# Patient Record
Sex: Female | Born: 1985
Health system: Southern US, Community
[De-identification: ages and names within clinical notes are randomized; demographics above are authoritative.]

## PROBLEM LIST (undated history)

## (undated) DIAGNOSIS — T7840XA Allergy, unspecified, initial encounter: Secondary | ICD-10-CM

## (undated) DIAGNOSIS — K219 Gastro-esophageal reflux disease without esophagitis: Secondary | ICD-10-CM

## (undated) DIAGNOSIS — Z8719 Personal history of other diseases of the digestive system: Secondary | ICD-10-CM

## (undated) DIAGNOSIS — E669 Obesity, unspecified: Secondary | ICD-10-CM

## (undated) DIAGNOSIS — J45909 Unspecified asthma, uncomplicated: Secondary | ICD-10-CM

## (undated) HISTORY — PX: BRONCHOSCOPY: SUR163

## (undated) HISTORY — DX: Gastro-esophageal reflux disease without esophagitis: K21.9

## (undated) HISTORY — PX: CHOLECYSTECTOMY: SHX55

## (undated) HISTORY — PX: ESOPHAGOGASTRODUODENOSCOPY: SHX1529

## (undated) HISTORY — DX: Unspecified asthma, uncomplicated: J45.909

## (undated) HISTORY — PX: EXTERNAL EAR SURGERY: SHX627

## (undated) HISTORY — DX: Obesity, unspecified: E66.9

## (undated) HISTORY — DX: Allergy, unspecified, initial encounter: T78.40XA

---
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
## 2004-03-08 ENCOUNTER — Other Ambulatory Visit: Admission: RE | Admit: 2004-03-08 | Discharge: 2004-03-08 | Payer: Self-pay | Admitting: *Deleted

## 2004-10-05 ENCOUNTER — Encounter (HOSPITAL_COMMUNITY): Admission: RE | Admit: 2004-10-05 | Discharge: 2005-01-03 | Payer: Self-pay | Admitting: Endocrinology

## 2005-03-13 ENCOUNTER — Other Ambulatory Visit: Admission: RE | Admit: 2005-03-13 | Discharge: 2005-03-13 | Payer: Self-pay | Admitting: *Deleted

## 2006-03-12 ENCOUNTER — Other Ambulatory Visit: Admission: RE | Admit: 2006-03-12 | Discharge: 2006-03-12 | Payer: Self-pay | Admitting: *Deleted

## 2007-05-12 ENCOUNTER — Other Ambulatory Visit: Admission: RE | Admit: 2007-05-12 | Discharge: 2007-05-12 | Payer: Self-pay | Admitting: *Deleted

## 2008-05-24 ENCOUNTER — Other Ambulatory Visit: Admission: RE | Admit: 2008-05-24 | Discharge: 2008-05-24 | Payer: Self-pay | Admitting: Family Medicine

## 2008-11-16 ENCOUNTER — Encounter: Admission: RE | Admit: 2008-11-16 | Discharge: 2008-11-16 | Payer: Self-pay | Admitting: Family Medicine

## 2008-11-16 ENCOUNTER — Encounter: Payer: Self-pay | Admitting: Emergency Medicine

## 2008-11-18 ENCOUNTER — Ambulatory Visit: Payer: Self-pay | Admitting: Emergency Medicine

## 2008-11-18 DIAGNOSIS — J45909 Unspecified asthma, uncomplicated: Secondary | ICD-10-CM | POA: Insufficient documentation

## 2008-11-18 DIAGNOSIS — J984 Other disorders of lung: Secondary | ICD-10-CM | POA: Insufficient documentation

## 2008-11-18 DIAGNOSIS — J309 Allergic rhinitis, unspecified: Secondary | ICD-10-CM | POA: Insufficient documentation

## 2008-11-25 ENCOUNTER — Encounter: Payer: Self-pay | Admitting: Emergency Medicine

## 2008-11-25 ENCOUNTER — Ambulatory Visit: Payer: Self-pay | Admitting: Emergency Medicine

## 2008-11-25 ENCOUNTER — Ambulatory Visit: Admission: RE | Admit: 2008-11-25 | Discharge: 2008-11-25 | Payer: Self-pay | Admitting: Emergency Medicine

## 2008-11-26 ENCOUNTER — Encounter: Admission: RE | Admit: 2008-11-26 | Discharge: 2008-11-26 | Payer: Self-pay | Admitting: Family Medicine

## 2008-11-29 ENCOUNTER — Telehealth (INDEPENDENT_AMBULATORY_CARE_PROVIDER_SITE_OTHER): Payer: Self-pay | Admitting: *Deleted

## 2008-12-31 ENCOUNTER — Ambulatory Visit: Payer: Self-pay | Admitting: Emergency Medicine

## 2009-04-28 ENCOUNTER — Telehealth: Payer: Self-pay | Admitting: Emergency Medicine

## 2009-04-28 ENCOUNTER — Ambulatory Visit: Payer: Self-pay | Admitting: Cardiology

## 2009-05-26 ENCOUNTER — Other Ambulatory Visit: Admission: RE | Admit: 2009-05-26 | Discharge: 2009-05-26 | Payer: Self-pay | Admitting: Family Medicine

## 2010-04-10 IMAGING — CT CT ANGIO CHEST
2 of 7 series · 19 of 36 positions shown · IV contrast ([ID] OMNI 300)
Comparison: None

CLINICAL DATA: Shortness of breath

CT ANGIOGRAPHY CHEST
TECHNIQUE: Multidetector CT imaging of the chest using the
standard protocol during bolus administration of intravenous
contrast. Multiplanar reconstructed images including MIPs were
obtained and reviewed to evaluate the vascular anatomy.
Contrast: 125 ml Imnipaque-YZZ IV

[Series 6: pe thin 1.25 · axial · 0.70mm/px · z∈[-198,+28]mm · 18 of 401 slices shown]
[im 20/401  lung]
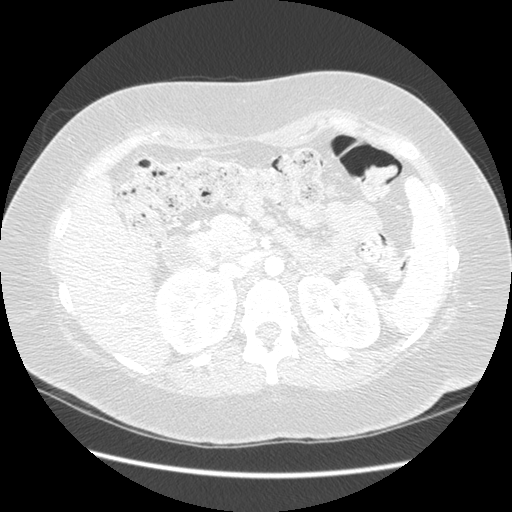
[im 39/401  mediastinal]
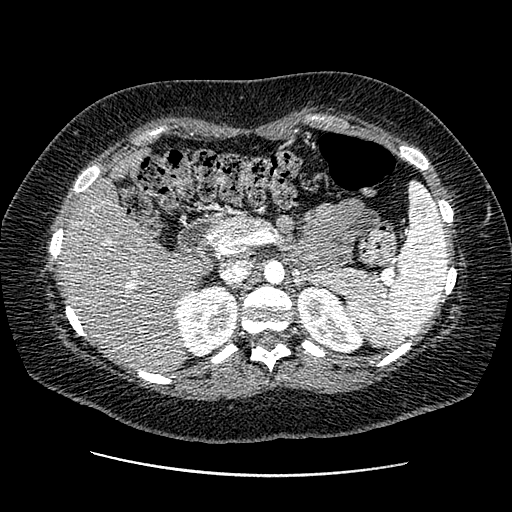
[im 58/401  lung]
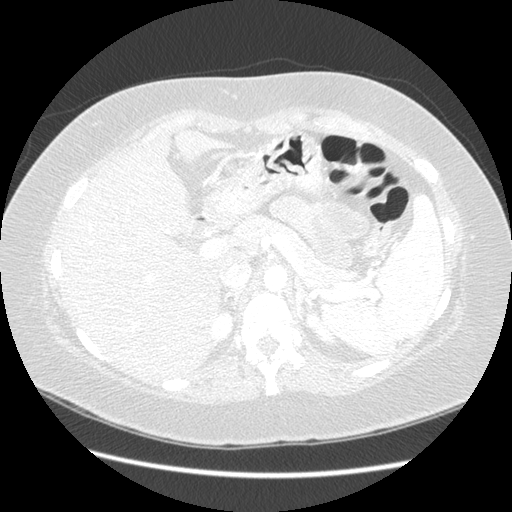
[im 77/401  mediastinal]
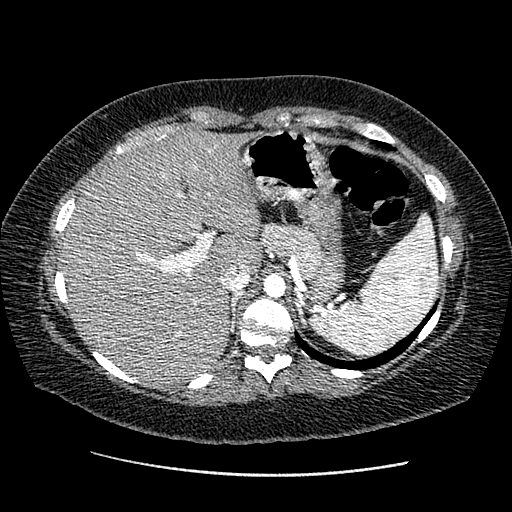
[im 115/401  lung]
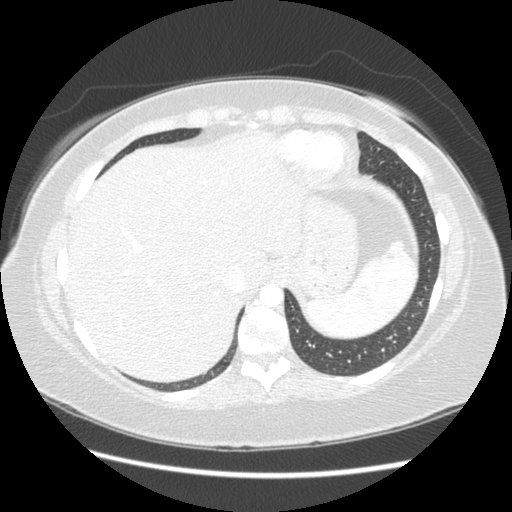
[im 134/401  mediastinal]
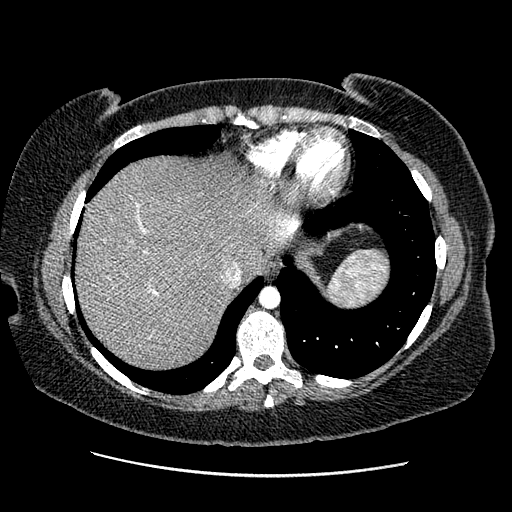
[im 153/401  lung]
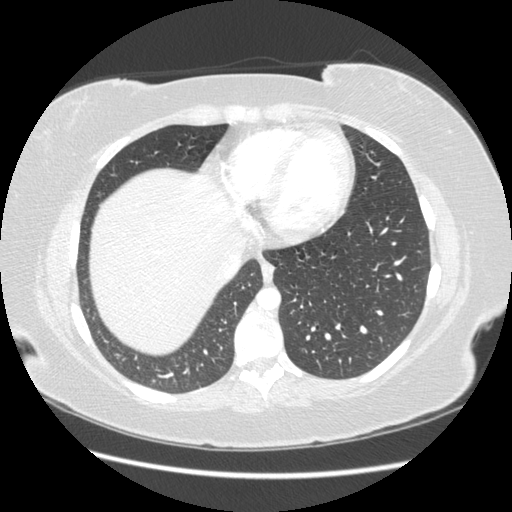
[im 172/401  mediastinal]
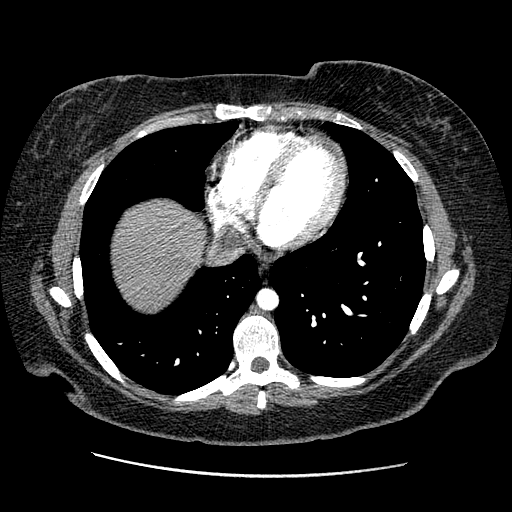
[im 191/401  lung]
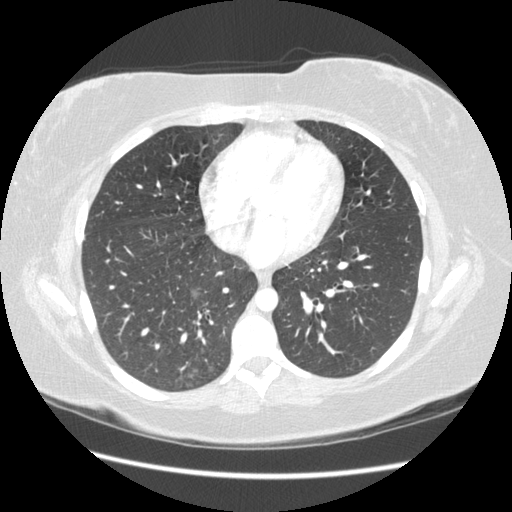
[im 210/401  mediastinal]
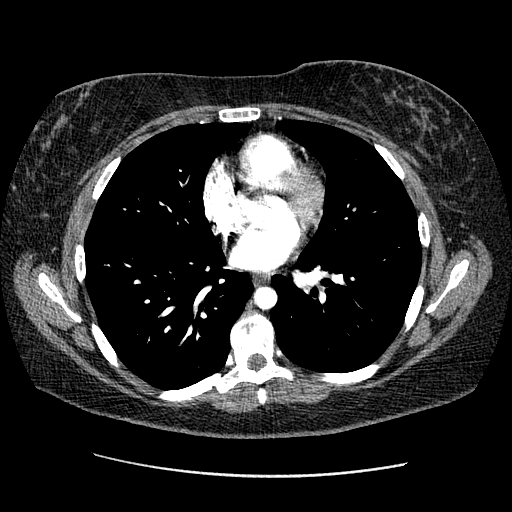
[im 229/401  lung]
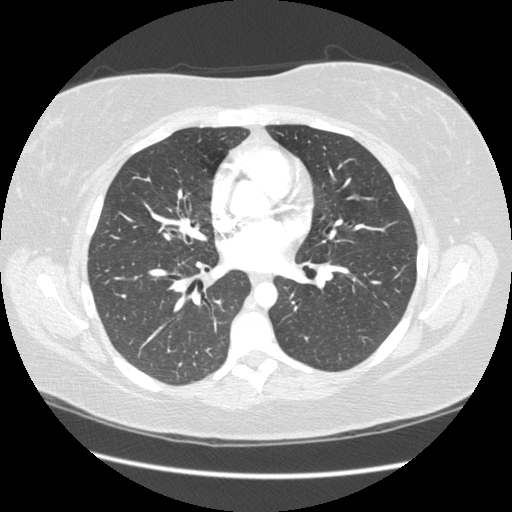
[im 248/401  mediastinal]
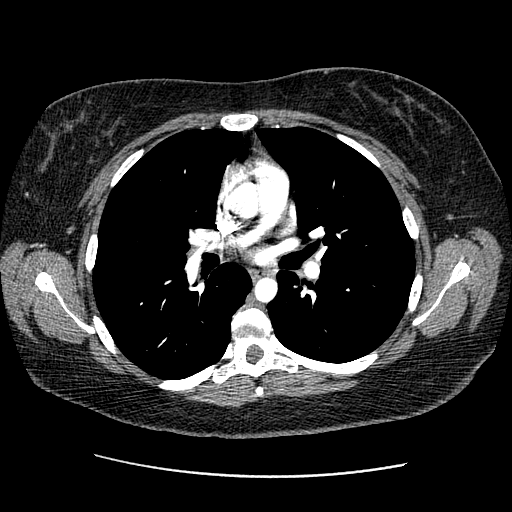
[im 267/401  lung]
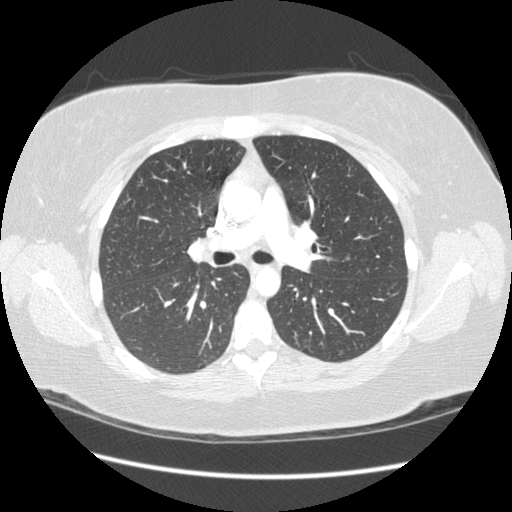
[im 305/401  mediastinal]
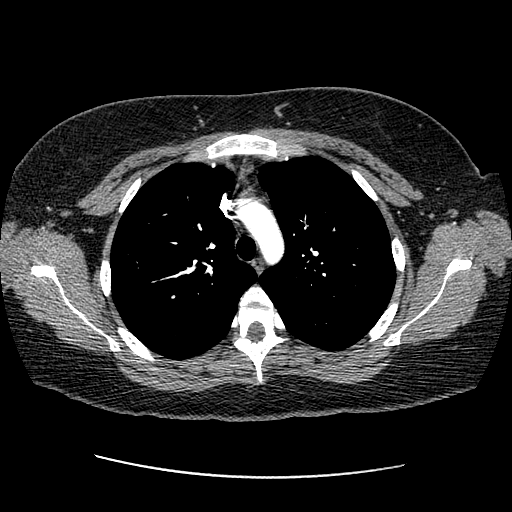
[im 324/401  lung]
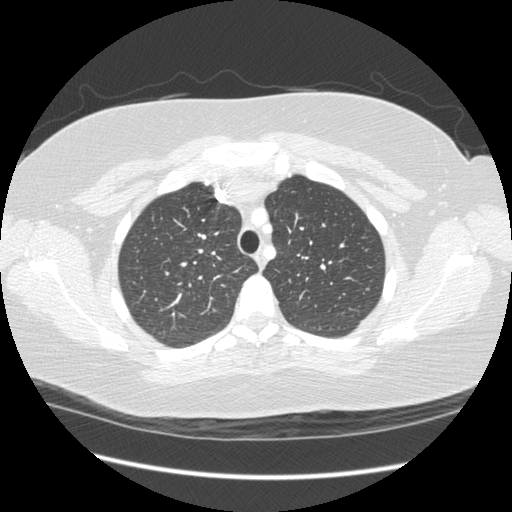
[im 343/401  mediastinal]
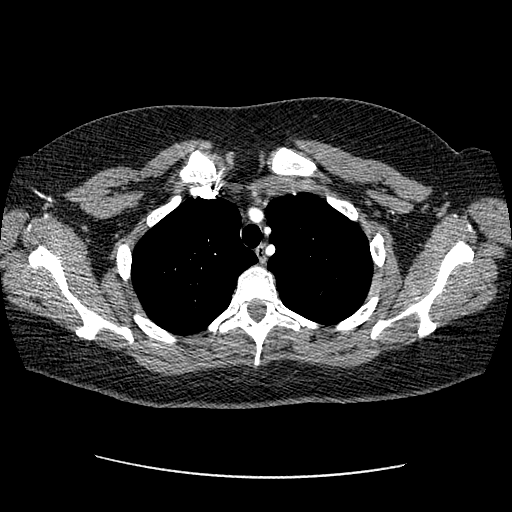
[im 362/401  lung]
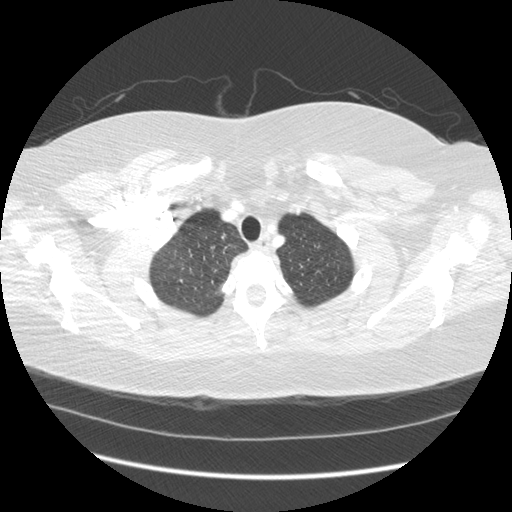
[im 381/401  mediastinal]
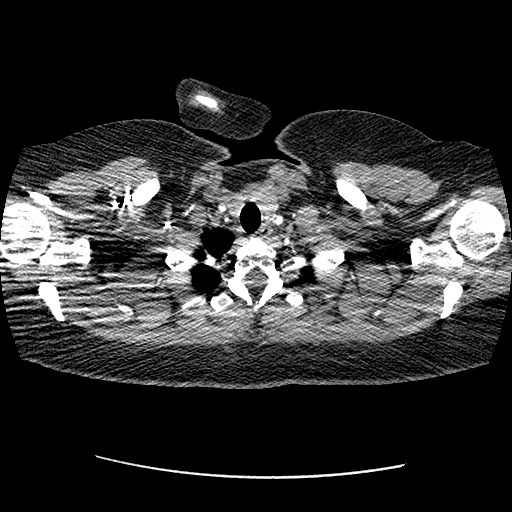

[Series 601: coronal · coronal · 0.70mm/px · 1 of 105 slices shown]
[im 53/105  mediastinal]
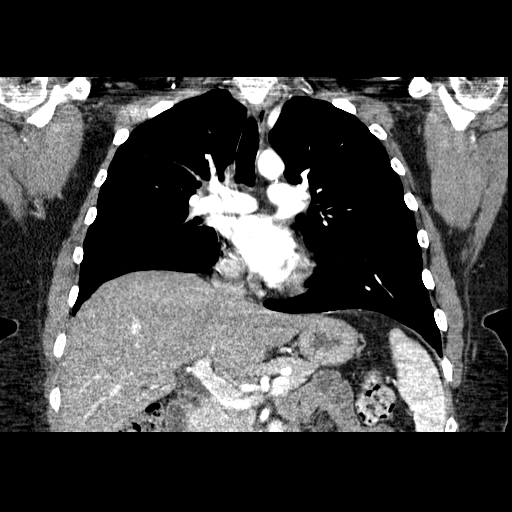

[19 of 36 positions shown; findings below may reference images not displayed]

FINDINGS: There is good contrast opacification of pulmonary artery
branches.  No discrete filling defects to suggest acute PE.  There
is good contrast opacification of the thoracic aorta without
evidence of dissection, stenosis or aneurysm.  No pleural or
pericardial effusion.  No hilar or mediastinal adenopathy.
Calcified sub centimeter granuloma in the posterolateral right
lower lobe.  Patchy somewhat nodular peripheral and subpleural
airspace opacities in the posterior and medial basal segments right
lower lobe.  The left lung is clear.  Mild spondylitic changes in
the lower thoracic spine.
IMPRESSION: 1.  Negative for acute PE or thoracic aortic dissection.
2.  Patchy nodular airspace opacities in the posterior and medial
right lower lobe, probably infectious.

## 2010-06-08 ENCOUNTER — Other Ambulatory Visit: Admission: RE | Admit: 2010-06-08 | Discharge: 2010-06-08 | Payer: Self-pay | Admitting: Family Medicine

## 2010-06-10 ENCOUNTER — Emergency Department (HOSPITAL_BASED_OUTPATIENT_CLINIC_OR_DEPARTMENT_OTHER): Admission: EM | Admit: 2010-06-10 | Discharge: 2010-06-10 | Payer: Self-pay | Admitting: Emergency Medicine

## 2010-10-14 ENCOUNTER — Encounter: Payer: Self-pay | Admitting: Endocrinology

## 2011-01-04 LAB — MISCELLANEOUS TEST

## 2011-01-04 LAB — AFB CULTURE WITH SMEAR (NOT AT ARMC): Acid Fast Smear: NONE SEEN

## 2011-01-04 LAB — FUNGUS CULTURE W SMEAR

## 2011-01-04 LAB — CULTURE, RESPIRATORY W GRAM STAIN: Gram Stain: NONE SEEN

## 2011-02-06 NOTE — Op Note (Signed)
[name], Sara Bryant                 ACCOUNT NO.:  [id number]   MEDICAL RECORD NO.:  [id number]          PATIENT TYPE:  AMB   LOCATION:  CARD                         FACILITY:  [facility]   PHYSICIAN:  Leslye Peer, MD    DATE OF BIRTH:  06-Jul-1986   DATE OF PROCEDURE:  11/25/2008  DATE OF DISCHARGE:                               OPERATIVE REPORT   OPERATOR:  Leslye Peer, MD   PROCEDURE:  Fiberoptic bronchoscopy with bronchoalveolar lavage and  transbronchial biopsies.   INDICATION:  Pulmonary nodules.   CONSENT:  Informed consent was obtained from the patient and a signed  copy is on her hospital chart.   MEDICATIONS GIVEN:  1. Fentanyl 125 mcg IV in divided doses.  2. Versed 5 mg IV in divided doses.  3. Lidocaine 1% 26 mL total to the bronchoalveolar tree.   PROCEDURE IN DETAIL:  After informed consent was obtained as outlined  above conscious sedation was initiated.  The bronchoscope was introduced  through the oropharynx using a bite block without difficulty.  Local  anesthesia was achieved with 1% lidocaine.  The trachea was intubated  and was normal in appearance.  All of the airways were inspected.  The  left mainstem, left upper lobe lingular and left lower lobe bronchi were  all evaluated and there were no endobronchial lesions or abnormal  secretions.  Attention was then turned to the right-sided exam.   The right mainstem, right upper lobe bronchus intermedius, right middle  lobe and right lower lobe airways were all widely patent without any  endobronchial lesions or abnormal secretions.  The exam was normal  throughout.  Bronchoalveolar lavage with 60 mL of normal saline  instilled and approximately 20 mL returned was performed from the right  lower lobe to be sent for cytology and microbiology.  Transbronchial  brushings were then performed from the right lower lobe to be sent for  dry slide preparation and AFB staining.  Finally, transbronchial  biopsies were  performed from several segments of the right lower lobe  under fluoroscopic guidance in order to send for pathology.  The patient  tolerated the procedure well.  There was some coughing but no other  complications.  She had some very minimal blood loss estimated  approximately 2 mL with good hemostasis at the end of the case.  She  returned to the recovery room in good condition.   SAMPLES:  1. Bronchial washings from the right lower lobe.  2. Bronchial brushings from the right lower lobe to be sent for AFB      slide staining.  3. Transbronchial biopsies from the right lower lobe to be sent for      pathology.   PLAN:  I will follow up the results of these studies with Ms. Heese when  they become available and we will plan further testing and therapy based  on those results.      Leslye Peer, MD  Electronically Signed     RSB/MEDQ  D:  11/25/2008  T:  11/25/2008  Job:  [id number]

## 2011-02-09 NOTE — Procedures (Signed)
[place]. [facility]  Patient:    Sara Bryant, Sara Bryant                        MRN: [id number] Proc. Date: 09/10/00 Adm. Date:  [id number] Disc. Date: [id number] Attending:  Orland Mustard CC:         Dr. Theressa Millard, dentist.  Bing Neighbors. Tenny Craw, M.D.   Procedure Report  PROCEDURE:  Esophageal manometry.  INDICATIONS:  Patient with persistent symptoms that could be reflux, with regurgitation back to the throat and apparently some sort of injury to her teeth that was felt to be due to dental problems due to reflux.  For this reason, esophageal manometry is performed as well as a 24-hour pH study.  DESCRIPTION OF PROCEDURE:  The procedure was performed in the [facility] manometry lab in the usual fashion.  No provocative studies were performed. The results were as follows:  1. Upper esophageal sphincter relaxed completely with swallowing.  No    pharyngeal spikes were present. 2. Esophageal body:  Normal peristalsis, mean amplitude 138 mm, with normal    peristalsis in all 10 wet swallows. 3. Lower esophageal sphincter:  Mean pressure 39 mm, residual 4.4 mm, with    87% relaxation, relaxed with all wet swallows seen.  ASSESSMENT:  Normal esophageal manometry.  PLAN:  Will proceed at this time with 24-hour pH probe. DD:  10/07/00 TD:  10/07/00 Job: [id number]

## 2011-02-09 NOTE — Procedures (Signed)
[facility]  Patient:    Sara Bryant, Sara Bryant                        MRN: [id number] Proc. Date: 10/02/00 Adm. Date:  [id number] Attending:  Orland Mustard CC:         Charles A. Tenny Craw, M.D.  Dola Argyle. Earl Gala, D.D.S., [address] [place]  [place], [place] [address]   Procedure Report  DATE OF BIRTH:  Mar 20, 1986  PROCEDURE:  Esophagogastroduodenoscopy.  MEDICATIONS:  Cetacaine spray, fentanyl 100 mcg, and Versed 8 mg IV.  INDICATIONS:  A 25 year old with vague epigastric pain going up into her throat.  She saw her dentist, who felt that the reflux was eating the enamel off of her teeth.  For this reason, an endoscopy was performed.  A 24-hour pH probe performed recently showed slightly reflux with a composite score of 30.9 with normal being less than 22.  DESCRIPTION OF PROCEDURE:  The procedure had been explained to the patient and consent obtained.  With the patient in the left lateral decubitus position, the Olympus video endoscope was inserted blindly into the esophagus and advanced under direct visualization.  The stomach was entered.  The pylorus was identified and passed.  The duodenum, including the bulb and second portion, was unremarkable.  The scope was withdrawn back into the stomach. The pyloric channel, antrum, and body were normal.  The fundus and cardia were seen well in retroflexed view and were normal.  There was a 2 cm hiatal hernia with a slightly reddened distal esophagus.  No ulcerations.  No obvious reflux.  The distal and proximal esophagus were seen well and were normal. The scope was withdrawn.  The patient tolerated the procedure well. Maintained on low-flow oxygen and pulse oximetry throughout the procedure with no obvious problem.  ASSESSMENT:  Hiatal hernia with probable mild reflux.  It is felt that this woman should respond to conservative therapy.  PLAN:  I am going to go ahead and continue her on Prevacid and we  will increase it to b.i.d.  Will see her back in the office in six weeks.  Will give a sheet of antireflux instructions. DD:  10/02/00 TD:  10/02/00 Job: [id number]

## 2011-02-09 NOTE — Op Note (Signed)
[place]. [facility]  Patient:    Sara Bryant, Sara Bryant                        MRN: [id number] Proc. Date: 09/10/00 Adm. Date:  [id number] Disc. Date: [id number] Attending:  Orland Mustard CC:         Dr. Theressa Millard (dentist)  Bing Neighbors. Tenny Craw, M.D.   Operative Report  PROCEDURE PERFORMED:  Esophageal monitoring with 24 hour pH probe.  ENDOSCOPIST:  Llana Aliment. Randa Evens, M.D.  INDICATIONS FOR PROCEDURE:  Patient with a history of reflux symptoms totally refractory to medical therapy.  In view of this a probe was performed. Esophageal manometry was performed just before this procedure.  DESCRIPTION OF PROCEDURE:  The procedure was carried out for 23 hours and 54 minutes.  The patient had the probe in place.  The results are as follows:  1 - Upright position.  Eleven episodes of reflux during three hours and 15 minutes.  Total time was 2.1%, normal being less than 6.3%.  2 - Supine.  110 episodes of reflux during 20 hours and 39 minutes.  Two episodes lasted over five minutes.  Total of 68 minutes of reflux 5.5% of the time.  A total of 121 episodes of reflux.  Two episodes lasted over five minutes with the longest lasting 13 minutes 5% of the time slightly outside the normal range of less than 4.2%.  The mean composite score was 30.9 with normal being less than 22.  IMPRESSION:  Slight esophageal reflux, somewhat difficult to study in this young teenager who apparently over a 24 hour period spent 20 hours and 39 minutes supine, which may be skewing the actual amount of supine reflux.  In any event, the slight amount of increased reflux shown on the study should be easily controlled with medications.  PLAN:  Will get her back in the office and discuss medical therapy with her further. DD:  09/06/01 TD:  10/07/00 Job: [id number]

## 2011-02-09 NOTE — Op Note (Signed)
[facility]  Patient:    Sara Bryant, Sara Bryant                        MRN: [id number] Proc. Date: 04/28/01 Adm. Date:  [id number] Attending:  Arlis Porta CC:         Llana Aliment. Randa Evens, M.D.  Charles A. Tenny Craw, M.D.   Operative Report  PREOPERATIVE DIAGNOSIS:  Chronic calculous cholecystitis.  POSTOPERATIVE DIAGNOSIS:  Chronic calculous cholecystitis.  PROCEDURE:  Laparoscopic cholecystectomy with intraoperative cholangiogram.  SURGEON:  Dr. Abbey Chatters.  ASSISTANT:  Dr. Carolynne Edouard.  ANESTHESIA:  General.  INDICATIONS FOR PROCEDURE:  This is a 25 year old female with some burning epigastric pain. She has had an upper endoscopy which showed mild reflux and a 24 hour pH study which showed a composite ______ score of 30.9 which is slightly elevated. She still has some pain in the epigastric region but sometimes she has pressure type pains as well. She ______ she gets nausea and vomits a lot. An ultrasound demonstrated gallstones. It is felt that some of the symptoms may be secondary to her gallbladder and she now presents for elective cholecystectomy. The procedure risks have been explained to her and her mother extensively.  TECHNIQUE:  She was placed supine on the operating table and a general anesthetic was administered. Her abdomen was sterilely prepped and draped. Local anesthetic consisting of 0.5% Marcaine was infiltrated in the subumbilical region. A small subumbilical incision was made incising his skin sharply and carrying this down to the subcutaneous tissue until the midline fascia was identified and a 1.5 cm incision was made in the midline fascia. A pursestring suture of #0 Vicryl was placed around the fascial edges. The peritoneal cavity was entered bluntly and under direct vision. A Hasson trocar was introduced into the peritoneal cavity and pneumoperitoneum created by insufflation of CO2 gas.  Next, she was placed in the appropriate  position and a laparoscope was introduced and the fundus of the gallbladder was noted to be partially intrahepatic. Under direct vision, a 10 mm trocar was placed in the epigastrium and two 5 mm trocars placed in the right upper quadrant. The fundus was grasped and then the infundibula were grasped and used carefully upon dissection. I was able to expose the cystic duct at its junction with the gallbladder as well as the cystic artery. The infundibulum was completely mobilized. The clip was placed at the gallbladder neck and then an incision made in the cystic duct. A cholangiocatheter was passed through the anterior abdominal wall into the cystic duct and cholangiogram was performed.  Under real-time fluoroscopy, dilute contrast material was injected into the cystic duct. It entered the common bile duct and promptly splashed into the duodenum without evidence of obstruction. The common hepatic and right and left hepatic ducts were seen as well. A visual interpretation is pending the radiologists review.  The cholangiocatheter was removed, the cystic duct was then clipped three times proximally and divided. The cystic artery was clipped and divided. The gallbladder was dissected free from the liver bed with the cautery. Because of its intrahepatic portions and perforations were made and only small stone fragments and these were evacuated by way of suction. The gallbladder was then placed in an endopouch bag. I then copiously irrigated out the gallbladder fossa and there were multiple raw surfaces. The bleeding throughout these was controlled with the cautery and Surgicel.  The gallbladder was then removed first through the subumbilical port and  per the patients and mothers request, a single stone was removed from it and then placed in a specimen cup for them.  Next, the perihepatic area was copiously irrigated until the fluid was clear. Once the fluid was clear, the fascial defect in  the subumbilical region was closed by tightening up and tying down the pursestring suture under direct laparoscopic vision. The remaining trocars were then removed and pneumoperitoneum was released.  The skin incisions were closed with 4-0 monocryl subcuticular stitches followed by Steri-Strips and sterile dressings.  The patient tolerated the procedure well without any apparent complications and was taken to the recovery room in satisfactory condition. DD:  04/28/01 TD:  04/29/01 Job: [id number]

## 2013-02-23 ENCOUNTER — Other Ambulatory Visit: Payer: Self-pay | Admitting: Family Medicine

## 2013-02-23 ENCOUNTER — Other Ambulatory Visit (HOSPITAL_COMMUNITY)
Admission: RE | Admit: 2013-02-23 | Discharge: 2013-02-23 | Disposition: A | Discharge status: Home / Self Care | Payer: BC Managed Care – PPO | Source: Ambulatory Visit | Attending: Family Medicine | Admitting: Family Medicine

## 2013-02-23 DIAGNOSIS — Z124 Encounter for screening for malignant neoplasm of cervix: Secondary | ICD-10-CM | POA: Insufficient documentation

## 2013-02-23 DIAGNOSIS — Z113 Encounter for screening for infections with a predominantly sexual mode of transmission: Secondary | ICD-10-CM | POA: Insufficient documentation

## 2016-01-16 DIAGNOSIS — K219 Gastro-esophageal reflux disease without esophagitis: Secondary | ICD-10-CM | POA: Diagnosis not present

## 2016-05-30 DIAGNOSIS — L309 Dermatitis, unspecified: Secondary | ICD-10-CM | POA: Diagnosis not present

## 2016-05-30 DIAGNOSIS — L299 Pruritus, unspecified: Secondary | ICD-10-CM | POA: Diagnosis not present

## 2016-06-03 DIAGNOSIS — J029 Acute pharyngitis, unspecified: Secondary | ICD-10-CM | POA: Diagnosis not present

## 2016-07-13 DIAGNOSIS — Z0000 Encounter for general adult medical examination without abnormal findings: Secondary | ICD-10-CM | POA: Diagnosis not present

## 2016-07-30 ENCOUNTER — Other Ambulatory Visit (HOSPITAL_COMMUNITY)
Admission: RE | Admit: 2016-07-30 | Discharge: 2016-07-30 | Disposition: A | Discharge status: Home / Self Care | Payer: BLUE CROSS/BLUE SHIELD | Source: Ambulatory Visit | Attending: Family Medicine | Admitting: Family Medicine

## 2016-07-30 ENCOUNTER — Other Ambulatory Visit: Payer: Self-pay | Admitting: Family Medicine

## 2016-07-30 DIAGNOSIS — Z113 Encounter for screening for infections with a predominantly sexual mode of transmission: Secondary | ICD-10-CM | POA: Insufficient documentation

## 2016-07-30 DIAGNOSIS — Z124 Encounter for screening for malignant neoplasm of cervix: Secondary | ICD-10-CM | POA: Insufficient documentation

## 2016-08-01 LAB — CYTOLOGY - PAP
Chlamydia: NEGATIVE
Diagnosis: NEGATIVE
Neisseria Gonorrhea: NEGATIVE

## 2017-01-27 DIAGNOSIS — J029 Acute pharyngitis, unspecified: Secondary | ICD-10-CM | POA: Diagnosis not present

## 2017-01-30 DIAGNOSIS — K219 Gastro-esophageal reflux disease without esophagitis: Secondary | ICD-10-CM | POA: Diagnosis not present

## 2017-05-09 DIAGNOSIS — K317 Polyp of stomach and duodenum: Secondary | ICD-10-CM | POA: Diagnosis not present

## 2017-05-09 DIAGNOSIS — K219 Gastro-esophageal reflux disease without esophagitis: Secondary | ICD-10-CM | POA: Diagnosis not present

## 2017-05-13 DIAGNOSIS — K317 Polyp of stomach and duodenum: Secondary | ICD-10-CM | POA: Diagnosis not present

## 2017-06-21 DIAGNOSIS — J069 Acute upper respiratory infection, unspecified: Secondary | ICD-10-CM | POA: Diagnosis not present

## 2017-06-21 DIAGNOSIS — R509 Fever, unspecified: Secondary | ICD-10-CM | POA: Diagnosis not present

## 2017-08-19 DIAGNOSIS — Z13220 Encounter for screening for lipoid disorders: Secondary | ICD-10-CM | POA: Diagnosis not present

## 2017-08-19 DIAGNOSIS — Z0000 Encounter for general adult medical examination without abnormal findings: Secondary | ICD-10-CM | POA: Diagnosis not present

## 2018-01-02 DIAGNOSIS — Z6841 Body Mass Index (BMI) 40.0 and over, adult: Secondary | ICD-10-CM | POA: Diagnosis not present

## 2018-01-02 DIAGNOSIS — Z9109 Other allergy status, other than to drugs and biological substances: Secondary | ICD-10-CM | POA: Diagnosis not present

## 2018-01-23 DIAGNOSIS — K219 Gastro-esophageal reflux disease without esophagitis: Secondary | ICD-10-CM | POA: Diagnosis not present

## 2018-04-24 DIAGNOSIS — J3489 Other specified disorders of nose and nasal sinuses: Secondary | ICD-10-CM | POA: Diagnosis not present

## 2018-05-15 DIAGNOSIS — J3489 Other specified disorders of nose and nasal sinuses: Secondary | ICD-10-CM | POA: Diagnosis not present

## 2018-08-24 DIAGNOSIS — R5383 Other fatigue: Secondary | ICD-10-CM | POA: Diagnosis not present

## 2018-10-07 DIAGNOSIS — Z23 Encounter for immunization: Secondary | ICD-10-CM | POA: Diagnosis not present

## 2018-10-07 DIAGNOSIS — R05 Cough: Secondary | ICD-10-CM | POA: Diagnosis not present

## 2018-10-07 DIAGNOSIS — Z0000 Encounter for general adult medical examination without abnormal findings: Secondary | ICD-10-CM | POA: Diagnosis not present

## 2018-10-07 DIAGNOSIS — Z309 Encounter for contraceptive management, unspecified: Secondary | ICD-10-CM | POA: Diagnosis not present

## 2018-10-23 DIAGNOSIS — R509 Fever, unspecified: Secondary | ICD-10-CM | POA: Diagnosis not present

## 2018-11-11 DIAGNOSIS — J3489 Other specified disorders of nose and nasal sinuses: Secondary | ICD-10-CM | POA: Diagnosis not present

## 2019-01-08 DIAGNOSIS — K219 Gastro-esophageal reflux disease without esophagitis: Secondary | ICD-10-CM | POA: Diagnosis not present

## 2019-05-12 DIAGNOSIS — J3489 Other specified disorders of nose and nasal sinuses: Secondary | ICD-10-CM | POA: Diagnosis not present

## 2019-08-11 DIAGNOSIS — Z20828 Contact with and (suspected) exposure to other viral communicable diseases: Secondary | ICD-10-CM | POA: Diagnosis not present

## 2019-10-21 ENCOUNTER — Other Ambulatory Visit: Payer: Self-pay | Admitting: Family Medicine

## 2019-10-21 ENCOUNTER — Other Ambulatory Visit (HOSPITAL_COMMUNITY)
Admission: RE | Admit: 2019-10-21 | Discharge: 2019-10-21 | Disposition: A | Discharge status: Home / Self Care | Payer: BC Managed Care – PPO | Source: Ambulatory Visit | Attending: Family Medicine | Admitting: Family Medicine

## 2019-10-21 DIAGNOSIS — J45909 Unspecified asthma, uncomplicated: Secondary | ICD-10-CM | POA: Diagnosis not present

## 2019-10-21 DIAGNOSIS — Z79899 Other long term (current) drug therapy: Secondary | ICD-10-CM | POA: Diagnosis not present

## 2019-10-21 DIAGNOSIS — Z124 Encounter for screening for malignant neoplasm of cervix: Secondary | ICD-10-CM | POA: Diagnosis not present

## 2019-10-21 DIAGNOSIS — Z13220 Encounter for screening for lipoid disorders: Secondary | ICD-10-CM | POA: Diagnosis not present

## 2019-10-21 DIAGNOSIS — Z0000 Encounter for general adult medical examination without abnormal findings: Secondary | ICD-10-CM | POA: Diagnosis not present

## 2019-10-21 DIAGNOSIS — R05 Cough: Secondary | ICD-10-CM | POA: Diagnosis not present

## 2019-10-26 LAB — CYTOLOGY - PAP: Diagnosis: NEGATIVE

## 2019-11-17 DIAGNOSIS — H8110 Benign paroxysmal vertigo, unspecified ear: Secondary | ICD-10-CM | POA: Diagnosis not present

## 2020-04-27 DIAGNOSIS — K219 Gastro-esophageal reflux disease without esophagitis: Secondary | ICD-10-CM | POA: Diagnosis not present

## 2020-04-29 ENCOUNTER — Encounter: Payer: Self-pay | Admitting: Gastroenterology

## 2020-05-10 DIAGNOSIS — J3489 Other specified disorders of nose and nasal sinuses: Secondary | ICD-10-CM | POA: Diagnosis not present

## 2020-06-21 ENCOUNTER — Ambulatory Visit: Payer: BC Managed Care – PPO | Admitting: Gastroenterology

## 2020-06-21 ENCOUNTER — Encounter: Payer: Self-pay | Admitting: Gastroenterology

## 2020-06-21 VITALS — BP 120/74 | HR 104 | Ht 62.25 in | Wt 215.5 lb

## 2020-06-21 DIAGNOSIS — K449 Diaphragmatic hernia without obstruction or gangrene: Secondary | ICD-10-CM | POA: Diagnosis not present

## 2020-06-21 DIAGNOSIS — Z6838 Body mass index (BMI) 38.0-38.9, adult: Secondary | ICD-10-CM | POA: Diagnosis not present

## 2020-06-21 DIAGNOSIS — E669 Obesity, unspecified: Secondary | ICD-10-CM

## 2020-06-21 DIAGNOSIS — K219 Gastro-esophageal reflux disease without esophagitis: Secondary | ICD-10-CM

## 2020-06-21 MED ORDER — DEXILANT 60 MG PO CPDR: 60.0000 mg | DELAYED_RELEASE_CAPSULE | Freq: Every day | ORAL | 3 refills | Status: DC

## 2020-06-21 NOTE — Patient Instructions (Addendum)
If you are age 36 or older, your body mass index should be between 23-30. Your Body mass index is 39.1 kg/m. If this is out of the aforementioned range listed, please consider follow up with your Primary Care Provider.  If you are age 50 or younger, your body mass index should be between 19-25. Your Body mass index is 39.1 kg/m. If this is out of the aformentioned range listed, please consider follow up with your Primary Care Provider.    You have been scheduled for an endoscopy. Please follow written instructions given to you at your visit today. If you use inhalers (even only as needed), please bring them with you on the day of your procedure.  STOP DEXILANT AND PEPCID FIVE DAYS BEFORE.  We have sent the following medications to your pharmacy for you to pick up at your convenience: [place] have been given samples of Dexilant 60 mg  It was a pleasure to see you today!  Vito Cirigliano, D.O.

## 2020-06-21 NOTE — Progress Notes (Signed)
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 Watt Climes evaluation of possible antireflux intervention with Transoral Incisionless Fundoplication (TIF) with a goal to stop or significantly reduce acid suppression therapy.  Was diagnosed with GERD ~age [age], with multiple acid suppression agents over the years.   Reflux generally improved with PPI, but takes Zantac as needed for breakthrough symptoms, typically at night once or twice/month.  Recently changed from Nexium bid to pantoprazole 40 mg/day in 04/2020 due to insurance no longer covering Nexium. Trialed pantoprazole for 4 weeks, but with worsening regurgitation. Trilaed Pepcid 40 mg BID, with minimal improvement.   Has been diagnosed with enamelerosion.   Avoids eating close to bedtime. Sleeps with HOB.   GERD history: -Index symptoms: Regurgitation -Exacerbating features: Post prandial independent of food types -Medications trialed: Zantac, Nexium, Prilosec -Current medications: Pantoprazole 40 mg/day, Pepcid as needed; MVI with calcium/vitamin D -Complications: Hiatal hernia, enamel erosion  GERD evaluation: -Last EGD: 04/2017 -Barium esophagram: None -Esophageal Manometry: None -pH/Impedance: reports this was done ~1999/2000. No report for review.  -Bravo: None -GES: 03/2001: Normal  Endoscopic History: -EGD (04/2017, Dr. Oletta Lamas): Fundic gland polyps, medium sized hiatal hernia, no Barrett's -EGD (2010, Dr. Oletta Lamas): Normal esophagus, normal small bowel with biopsies negative for Celiac -EGD (2000): Esophagitis per patient. No report for review   GERD-HRQL Questionnaire Score: To complete at follow-up   Past Medical History:  Diagnosis Date  . Asthma   . Obesity      Past  Surgical History:  Procedure Laterality Date  . BRONCHOSCOPY    . CHOLECYSTECTOMY    . ESOPHAGOGASTRODUODENOSCOPY     x4 last one done done 2018-2019 with Dr Oletta Lamas at [facility]  . EXTERNAL EAR SURGERY     Family History  Adopted: Yes   Social History   Tobacco Use  . Smoking status: Never Smoker  . Smokeless tobacco: Never Used  Vaping Use  . Vaping Use: Never used  Substance Use Topics  . Alcohol use: Not Currently  . Drug use: Not Currently   Current Outpatient Medications  Medication Sig Dispense Refill  . albuterol (VENTOLIN HFA) 108 (90 Base) MCG/ACT inhaler Inhale 2 puffs into the lungs as needed.    . calcium-vitamin D 250-100 MG-UNIT tablet Take 1 tablet by mouth daily.    . famotidine (PEPCID) 40 MG tablet Take 40 mg by mouth 2 (two) times daily.    Lenda Kelp FE 1.5/30 1.5-30 MG-MCG tablet Take 1 tablet by mouth daily.    . Magnesium 250 MG TABS Take 1 tablet by mouth daily.    . Mometasone Furo-Formoterol Fum (DULERA IN) Inhale 2 puffs into the lungs as needed.    . montelukast (SINGULAIR) 10 MG tablet 1 tablet daily.    . Multiple Vitamin (MULTIVITAMIN) tablet Take 1 tablet by mouth daily.     No current facility-administered medications for this visit.   Allergies  Allergen Reactions  . Levofloxacin Other (See Comments)    Pt states it effects her liver and causes jaundice.     Review of Systems: All systems reviewed and negative except where noted in HPI.     Physical Exam:    Wt Readings from  Last 3 Encounters:  06/21/20 215 lb 8 oz (97.8 kg)    BP 120/74   Pulse (!) 104   Ht 5' 2.25" (1.581 m)   Wt 215 lb 8 oz (97.8 kg)   BMI 39.10 kg/m  Constitutional:  Pleasant, in no acute distress. Psychiatric: Normal mood and affect. Behavior is normal. EENT: Pupils normal.  Conjunctivae are normal. No scleral icterus. Neck supple. No cervical LAD. Cardiovascular: Normal rate, regular rhythm. No edema Pulmonary/chest: Effort normal and breath sounds  normal. No wheezing, rales or rhonchi. Abdominal: Soft, nondistended, nontender. Bowel sounds active throughout. There are no masses palpable. No hepatomegaly. Neurological: Alert and oriented to person place and time. Skin: Skin is warm and dry. No rashes noted.   ASSESSMENT AND PLAN;   1) GERD 2) Hiatal hernia 3) Obesity (BMI 38)  Discussed pathophysiology of reflux at length today.  Discussed treatment options, to include ongoing medical management vs antireflux surgical options, to include hiatal hernia repair, laparoscopic fundoplication, TIF, etc with plan for the following:  -EGD with Bravo placement.  To be done off PPI/H2 RA x5 days -Will trial course of Dexilant.  Provided with samples today and Rx for 60 mg/day -If Dexilant not well covered, will engage with [employer] re Nexium coverage -Did discuss that by current literature, BMI >35 is a relative contraindication to TIF.  She has been doing a great job at dieting and regular exercise, with weight loss and body fat loss readily documented on a tracker app that she has.  Applauded these efforts, and encouraged continued weight loss through diet/exercise -Evaluate size of hiatal hernia along with presence of erosive esophagitis at time of EGD as above for preoperative assessment -History of nose fracture with deviated septum w/ septal defect  The indications, risks, and benefits of EGD with Bravo placement were explained to the patient in detail. Risks include but are not limited to bleeding, perforation, adverse reaction to medications, and cardiopulmonary compromise. Sequelae include but are not limited to the possibility of surgery, hositalization, and mortality. The patient verbalized understanding and wished to proceed. All questions answered, referred to scheduler. Further recommendations pending results of the exam.     [name], DO, FACG  06/21/2020, 8:58 AM   Clarene Essex, MD

## 2020-06-27 ENCOUNTER — Encounter: Payer: Self-pay | Admitting: Gastroenterology

## 2020-07-07 ENCOUNTER — Telehealth: Payer: Self-pay | Admitting: *Deleted

## 2020-07-07 NOTE — Telephone Encounter (Signed)
Patient has had complete series of covid immunizations.

## 2020-07-07 NOTE — Telephone Encounter (Signed)
Called patient to inquire about covid tests or immunizations.  Message left to call us.

## 2020-07-08 ENCOUNTER — Ambulatory Visit (AMBULATORY_SURGERY_CENTER): Payer: BC Managed Care – PPO | Admitting: Gastroenterology

## 2020-07-08 ENCOUNTER — Encounter: Payer: Self-pay | Admitting: Gastroenterology

## 2020-07-08 ENCOUNTER — Encounter: Payer: BC Managed Care – PPO | Admitting: Gastroenterology

## 2020-07-08 ENCOUNTER — Other Ambulatory Visit: Payer: Self-pay

## 2020-07-08 VITALS — BP 115/70 | HR 80 | Temp 97.3°F | Resp 16 | Ht 62.0 in | Wt 215.0 lb

## 2020-07-08 DIAGNOSIS — K219 Gastro-esophageal reflux disease without esophagitis: Secondary | ICD-10-CM | POA: Diagnosis not present

## 2020-07-08 DIAGNOSIS — R12 Heartburn: Secondary | ICD-10-CM | POA: Diagnosis not present

## 2020-07-08 DIAGNOSIS — K317 Polyp of stomach and duodenum: Secondary | ICD-10-CM | POA: Diagnosis not present

## 2020-07-08 DIAGNOSIS — K449 Diaphragmatic hernia without obstruction or gangrene: Secondary | ICD-10-CM | POA: Diagnosis not present

## 2020-07-08 DIAGNOSIS — K229 Disease of esophagus, unspecified: Secondary | ICD-10-CM

## 2020-07-08 DIAGNOSIS — R1110 Vomiting, unspecified: Secondary | ICD-10-CM

## 2020-07-08 MED ORDER — SODIUM CHLORIDE 0.9 % IV SOLN: 500.0000 mL | Freq: Once | INTRAVENOUS | Status: DC

## 2020-07-08 NOTE — Patient Instructions (Addendum)
Handout given:  Hiatal hernia, Bravo diary Resume previous diet Continue holding antiacid for the next 48 hours, but ok to resume other medications Await pathology results  YOU HAD AN ENDOSCOPIC PROCEDURE TODAY AT [facility]:   Refer to the procedure report that was given to you for any specific questions about what was found during the examination.  If the procedure report does not answer your questions, please call your gastroenterologist to clarify.  If you requested that your care partner not be given the details of your procedure findings, then the procedure report has been included in a sealed envelope for you to review at your convenience later.  YOU SHOULD EXPECT: Some feelings of bloating in the abdomen. Passage of more gas than usual.  Walking can help get rid of the air that was put into your GI tract during the procedure and reduce the bloating. If you had a lower endoscopy (such as a colonoscopy or flexible sigmoidoscopy) you may notice spotting of blood in your stool or on the toilet paper. If you underwent a bowel prep for your procedure, you may not have a normal bowel movement for a few days.  Please Note:  You might notice some irritation and congestion in your nose or some drainage.  This is from the oxygen used during your procedure.  There is no need for concern and it should clear up in a day or so.  SYMPTOMS TO REPORT IMMEDIATELY:  Post-op Bravo pH instructions Once you get home:  Eat normally and go about your daily routine/activities Limit drinking fluids or eating between meals Do not chew gum or eat hard candy DO NOT take any antacid or anti-reflux medications during the 48-hour monitoring time, unless instructed by your physician  Recording events: Events to be recorded are:  Record using event buttons on recorder and write on paper diary form Every time you eat or drink something (other than water) 2.   Periods of lying down/reclining 3.   Symptoms:  may include heartburn, regurgitation, chest pain, cough or specify if other.  A paper diary is also provided to record the times of your reflux symptoms and times for meals and when you lie down.  The recorder needs to remain within 3 feet (arms length) of you during the testing period (48 hours). If you should forget and move outside of a 3-foot radius of the receiver you may hear beeping and you will see a "C1" error in the display window on the top of the receiver.  Please pick up the receiver and hold close to you to re-establish the connection and the error message disappears.  You may take a bath/shower during the testing period, but the recorder must not get wet and must remain within 3 feet of you. Please leave the receiver outside of the shower or tub while bathing. The monitoring period will be for 48 hours after placement of the capsule.  At the end of the 48 hours, you will return the recorder, and your diary, to our [facility] front desk.  A nurse will meet you to collect the device and answer any questions you may have.  The device should turn off once the 48 hours is complete.   What to expect after placement of the capsule:  Some patients experience a vague sensation that something is in their esophagus or that they 'feel' the capsule when they swallow food.  Should you experience this, chewing food carefully or drinking  liquids may minimize this sensation.   After the test is complete, the disposable capsule will fall off the wall of your esophagus within 5-10 days and pass naturally with your bowel movement through the digestive tract.  Once the recorder is returned, your provider will review and interpret your recordings and contact you to discuss your results.  This may take up to two weeks.   DO NOT have an MRI for 30 days after your procedure to ensure the capsule is no longer inside your body   It is imperative that you return the recorder on  __Monday October 18_ by 3:00pm.  Your information must be downloaded at this time to obtain your results.   Following upper endoscopy (EGD)  Vomiting of blood or coffee ground material  New chest pain or pain under the shoulder blades  Painful or persistently difficult swallowing  New shortness of breath  Fever of 100F or higher  Black, tarry-looking stools  For urgent or emergent issues, a gastroenterologist can be reached at any hour by calling [phone]. Do not use MyChart messaging for urgent concerns.    DIET:  We do recommend a small meal at first, but then you may proceed to your regular diet.  Drink plenty of fluids but you should avoid alcoholic beverages for 24 hours.  ACTIVITY:  You should plan to take it easy for the rest of today and you should NOT DRIVE or use heavy machinery until tomorrow (because of the sedation medicines used during the test).    FOLLOW UP: Our staff will call the number listed on your records 48-72 hours following your procedure to check on you and address any questions or concerns that you may have regarding the information given to you following your procedure. If we do not reach you, we will leave a message.  We will attempt to reach you two times.  During this call, we will ask if you have developed any symptoms of COVID 19. If you develop any symptoms (ie: fever, flu-like symptoms, shortness of breath, cough etc.) before then, please call [phone].  If you test positive for Covid 19 in the 2 weeks post procedure, please call and report this information to [place].    If any biopsies were taken you will be contacted by phone or by letter within the next 1-3 weeks.  Please call us at [phone] if you have not heard about the biopsies in 3 weeks.    SIGNATURES/CONFIDENTIALITY: You and/or your care partner have signed paperwork which will be entered into your electronic medical record.  These signatures attest to the fact that that the  information above on your After Visit Summary has been reviewed and is understood.  Full responsibility of the confidentiality of this discharge information lies with you and/or your care-partner.

## 2020-07-08 NOTE — Progress Notes (Signed)
A/ox3, pleased with MAC, report to RN 

## 2020-07-08 NOTE — Progress Notes (Signed)
  z line 34 cm Bravo capsule placed @ 28 cm without difficulty   Exp date 10/11/2021  Capsule ID #[id number]  Lot # [id number]

## 2020-07-08 NOTE — Op Note (Signed)
[place] Patient Name: Sara Bryant Procedure Date: 07/08/2020 10:01 AM MRN: [id number] Endoscopist: Gerrit Heck , MD Age: 34 Referring MD:  Date of Birth: [birth year] Gender: Female Account #: [id number] Procedure:                Upper GI endoscopy with biopsy and BRAVO placement                            (OFF PPI) Indications:              Heartburn, Suspected esophageal reflux, Exclusion                            of hiatal hernia                           34 yo female with persistent GERD despite acid                            suppression therapy, presents for endoscopic                            evaluation and potential BRAVO placement along with                            pre-operative assessment. Medicines:                Monitored Anesthesia Care Procedure:                Pre-Anesthesia Assessment:                           - Prior to the procedure, a History and Physical                            was performed, and patient medications and                            allergies were reviewed. The patient's tolerance of                            previous anesthesia was also reviewed. The risks                            and benefits of the procedure and the sedation                            options and risks were discussed with the patient.                            All questions were answered, and informed consent                            was obtained. Prior Anticoagulants: The patient has  taken no previous anticoagulant or antiplatelet                            agents. ASA Grade Assessment: II - A patient with                            mild systemic disease. After reviewing the risks                            and benefits, the patient was deemed in                            satisfactory condition to undergo the procedure.                           After obtaining informed consent, the endoscope was                             passed under direct vision. Throughout the                            procedure, the patient's blood pressure, pulse, and                            oxygen saturations were monitored continuously. The                            Endoscope was introduced through the mouth, and                            advanced to the second part of duodenum. The upper                            GI endoscopy was accomplished without difficulty.                            The patient tolerated the procedure well. Scope In: Scope Out: Findings:                 The gastroesophageal flap valve was visualized                            endoscopically and classified as Hill Grade III                            (minimal fold, loose to endoscope, hiatal hernia                            likely).                           A 1-2 cm sliding type hiatal hernia was present.                            This measured  2-3 cm in transverse width on                            retroflexed views.                           The Z-line was regular and was found 34 cm from the                            incisors. The BRAVO capsule with delivery system                            was introduced through the mouth and advanced into                            the esophagus, such that the BRAVO pH capsule was                            positioned 29 cm from the incisors, which was 6 cm                            proximal to the GE junction. Suction was applied to                            the well of the BRAVO pH capsule to suck in the                            adjacent mucosa of the esophagus using the external                            vacuum pump set at a minimum vacuum pressure of 550                            mmHg for 45 seconds. The BRAVO pH capsule was then                            deployed by depressing the plunger on top of the                            handle to advance the locking pin into the mucosa,                             thereby attaching the capsule to the esophagus. The                            plunger was then rotated a quarter turn clockwise                            to release the capsule from the delivery system.  The delivery system was then withdrawn. Endoscopy                            was utilized for probe placement and diagnostic                            evaluation. The endoscope was then reinserted and                            confirmed appropriate placement.                           Multiple 2 to 8 mm sessile polyps with no bleeding                            were found in the gastric fundus and in the gastric                            body. These polyps were removed with a cold biopsy                            forceps. Resection and retrieval were complete.                            Estimated blood loss was minimal.                           The incisura, gastric antrum and pylorus were                            normal.                           The examined duodenum was normal. Complications:            No immediate complications. Estimated Blood Loss:     Estimated blood loss: none. Estimated blood loss                            was minimal. Impression:               - Gastroesophageal flap valve classified as Hill                            Grade III (minimal fold, loose to endoscope, hiatal                            hernia likely).                           - 1-2 cm sliding type hiatal hernia.                           - Z-line regular, 34 cm from the incisors.                           -  Multiple gastric polyps. Resected and retrieved.                           - Normal incisura, antrum and pylorus.                           - Normal examined duodenum.                           - The BRAVO pH capsule was positioned 29 cm from                            the incisors, which was 6 cm proximal to the GE                             junction. Recommendation:           - Patient has a contact number available for                            emergencies. The signs and symptoms of potential                            delayed complications were discussed with the                            patient. Return to normal activities tomorrow.                            Written discharge instructions were provided to the                            patient.                           - Resume previous diet.                           - Continue holding acid suppression therapy through                            the duration of the study. Otherwise, ok to resume                            any other present medications.                           - Await pathology results.                           - Return to GI clinic at appointment to be                            scheduled to review results and ongoing treatment  plan. Gerrit Heck, MD 07/08/2020 10:33:34 AM

## 2020-07-08 NOTE — Progress Notes (Signed)
Called to room to assist during endoscopic procedure.  Patient ID and intended procedure confirmed with present staff. Received instructions for my participation in the procedure from the performing physician.  

## 2020-07-08 NOTE — Progress Notes (Signed)
VS by CW. ?

## 2020-07-12 ENCOUNTER — Telehealth: Payer: Self-pay

## 2020-07-12 ENCOUNTER — Telehealth: Payer: Self-pay | Admitting: *Deleted

## 2020-07-12 DIAGNOSIS — R1110 Vomiting, unspecified: Secondary | ICD-10-CM | POA: Diagnosis not present

## 2020-07-12 DIAGNOSIS — K219 Gastro-esophageal reflux disease without esophagitis: Secondary | ICD-10-CM

## 2020-07-12 DIAGNOSIS — K229 Disease of esophagus, unspecified: Secondary | ICD-10-CM

## 2020-07-12 DIAGNOSIS — R12 Heartburn: Secondary | ICD-10-CM | POA: Diagnosis not present

## 2020-07-12 NOTE — Telephone Encounter (Signed)
  Follow up Call-  Call back number 07/08/2020  Post procedure Call Back phone  # [phone]  Permission to leave phone message Yes  Some recent data might be hidden     Patient questions:  Do you have a fever, pain , or abdominal swelling? No. Pain Score  0 *  Have you tolerated food without any problems? Yes.    Have you been able to return to your normal activities? Yes.    Do you have any questions about your discharge instructions: Diet   No. Medications  No. Follow up visit  No.  Do you have questions or concerns about your Care? No.  Actions: * If pain score is 4 or above: No action needed, pain <4.  1. Have you developed a fever since your procedure? No  2.   Have you had an respiratory symptoms (SOB or cough) since your procedure? no  3.   Have you tested positive for COVID 19 since your procedure no  4.   Have you had any family members/close contacts diagnosed with the COVID 19 since your procedure?  no   If yes to any of these questions please route to Joylene John, RN and Joella Prince, RN

## 2020-07-12 NOTE — Telephone Encounter (Signed)
First attempt, left VM.  

## 2020-07-22 ENCOUNTER — Encounter: Payer: Self-pay | Admitting: Gastroenterology

## 2020-08-01 ENCOUNTER — Telehealth: Payer: Self-pay | Admitting: Gastroenterology

## 2020-08-01 NOTE — Telephone Encounter (Signed)
Spoke to patient to inform her of EGD/BRAVO results and recommendations. A follow up appointment has been scheduled to discuss cTIF. All questions answered patient voiced understanding.

## 2020-08-01 NOTE — Telephone Encounter (Signed)
EGD with Bravo completed on 07/08/2020 (off PPI) and notable for the following:  1.  Increase esophageal acid exposure with % time pH<4 5.0%.  DeMeester score 18.8 (normal <14.72) 2.  Esophageal exposure is abnormal in supine position 3.  No symptoms reported for symptom correlation with reflux events based on SAP  Impression: Evidence of significant gastroesophageal reflux  Recommendations: -Okay to resume acid suppression therapy as previously prescribed -Follow-up in the GI clinic.  We will discuss ongoing surgical management options.  Given significant LES laxity with 3 cm transverse width hiatal hernia, I recommend concomitant laparoscopic hiatal hernia repair with fundoplication (cTIF).  If she would like to proceed, will place referral to the surgical clinic.

## 2020-09-06 DIAGNOSIS — J029 Acute pharyngitis, unspecified: Secondary | ICD-10-CM | POA: Diagnosis not present

## 2020-09-07 ENCOUNTER — Telehealth: Payer: Self-pay | Admitting: General Surgery

## 2020-09-07 ENCOUNTER — Ambulatory Visit: Payer: BC Managed Care – PPO | Admitting: Gastroenterology

## 2020-09-07 ENCOUNTER — Encounter: Payer: Self-pay | Admitting: Gastroenterology

## 2020-09-07 VITALS — BP 132/80 | HR 96 | Ht 62.25 in | Wt 219.0 lb

## 2020-09-07 DIAGNOSIS — E669 Obesity, unspecified: Secondary | ICD-10-CM | POA: Diagnosis not present

## 2020-09-07 DIAGNOSIS — Z6838 Body mass index (BMI) 38.0-38.9, adult: Secondary | ICD-10-CM

## 2020-09-07 DIAGNOSIS — K219 Gastro-esophageal reflux disease without esophagitis: Secondary | ICD-10-CM

## 2020-09-07 DIAGNOSIS — K449 Diaphragmatic hernia without obstruction or gangrene: Secondary | ICD-10-CM | POA: Diagnosis not present

## 2020-09-07 MED ORDER — DEXLANSOPRAZOLE 60 MG PO CPDR: 60.0000 mg | DELAYED_RELEASE_CAPSULE | Freq: Every day | ORAL | 3 refills | Status: DC

## 2020-09-07 NOTE — Telephone Encounter (Signed)
Sent instructions to patient via mail. notifed patient of appointment times

## 2020-09-07 NOTE — Progress Notes (Signed)
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 Watt Climes evaluation of possible antireflux intervention with Transoral Incisionless Fundoplication (TIF) with a goal to stop or significantly reduce acid suppression therapy.  Was diagnosed with GERD ~age [age], with multiple acid suppression agents over the years.   Most recently with suboptimal response to high-dose pantoprazole, so changed to Dexilant 60 mg/day in 05/2020 with good response.    Has been diagnosed with enamel erosion.   Avoids eating close to bedtime. Sleeps with HOB.   GERD history: -Index symptoms: Regurgitation -Exacerbating features: Post prandial independent of food types -Medications trialed: Zantac, Nexium, Prilosec, pantoprazole, Pepcid -Current medications:  Dexilant 60 mg/day; MVI with calcium/vitamin D -Complications: Hiatal hernia, enamel erosion  GERD evaluation: -Last EGD:  06/2020 -Barium esophagram: None -Esophageal Manometry: None -pH/Impedance: reports this was done ~1999/2000. No report for review.  -Bravo:  06/2020:  Increase esophageal acid exposure with % time pH<4 5.0%.  DeMeester score 18.8 (normal <14.72). Esophageal exposure is abnormal in supine position. No symptoms reported for symptom correlation with reflux events based on SAP. C/w significant gastroesophageal reflux -GES: 03/2001: Normal  Endoscopic History: -EGD (06/2020, Dr. Bryan Lemma): Hill grade 3 valve, 1-2 cm axial height x 3 cm transverse width hiatal hernia, fundic gland polyps. Bravo placed -EGD (04/2017, Dr. Oletta Lamas): Fundic gland polyps, medium sized hiatal hernia, no Barrett's -EGD (2010, Dr. Oletta Lamas): Normal esophagus, normal small bowel with biopsies negative for Celiac -EGD (2000): Esophagitis per patient. No report for  review  HPI:     Patient is a 34 y.o. female presenting to the Gastroenterology Clinic for follow-up. Initially seen by me on 06/21/2020 for evaluation of TIF. Since then, completed EGD (Hill grade 3 valve, 2-3 cm HH), Bravo) significant acid exposure), and returns today to discuss results and ongoing treatment plan.  She is otherwise without any new issues today. Reflux well controlled on Dexilant.   Has been tracking her weights closely at home and with tracking app; this AM is 210# and BMI 38.8 at home.  Continues regular exercise, going to the gym 6 days/week.  Cycles 75-80 miles per week.   Review of systems:     No chest pain, no SOB, no fevers, no urinary sx   Past Medical History:  Diagnosis Date  . Allergy   . Asthma   . GERD (gastroesophageal reflux disease)   . Obesity     Patient's surgical history, family medical history, social history, medications and allergies were all reviewed in Epic    Current Outpatient Medications  Medication Sig Dispense Refill  . albuterol (VENTOLIN HFA) 108 (90 Base) MCG/ACT inhaler Inhale 2 puffs into the lungs as needed.    . calcium-vitamin D 250-100 MG-UNIT tablet Take 1 tablet by mouth daily.    Marland Kitchen dexlansoprazole (DEXILANT) 60 MG capsule Take 1 capsule (60 mg total) by mouth daily. 30 capsule 3  . JUNEL FE 1.5/30 1.5-30 MG-MCG tablet Take 1 tablet by mouth daily.    . Magnesium 250 MG TABS Take 1 tablet by mouth daily.    . Mometasone Furo-Formoterol Fum (DULERA IN) Inhale 2 puffs into the lungs as needed.    . montelukast (SINGULAIR) 10 MG tablet 1 tablet daily.    . Multiple Vitamin (MULTIVITAMIN) tablet Take 1 tablet by mouth daily.  Current Facility-Administered Medications  Medication Dose Route Frequency Provider Last Rate Last Admin  . 0.9 %  sodium chloride infusion  500 mL Intravenous Once Racquelle Hyser V, DO        Physical Exam:     BP 132/80 (BP Location: Right Arm, Patient Position: Sitting, Cuff Size: Normal)  Comment (Patient Position): forearm  Pulse 96   Ht 5' 2.25" (1.581 m)   Wt 219 lb (99.3 kg)   SpO2 96%   BMI 39.73 kg/m   GENERAL:  Pleasant female in NAD PSYCH: : Cooperative, normal affect NEURO: Alert and oriented x 3, no focal neurologic deficits   IMPRESSION and PLAN:    1) GERD 2) Hiatal hernia 3) Obesity (BMI 46)  34 year old female with longstanding history of reflux with clear, objective evidence of refluxate on recent Bravo.  Again had a long discussion regarding pathophysiology of reflux along with treatment options, to include ongoing medical management, cTIF, laparoscopic HH repair/fundoplication.  Discussed her current BMI (38) at length.  She has been going to the gym regularly for >1 year, with initial good weight loss and significant loss of central adiposity (lost 5 pant sizes).  Weight now mostly stagnant, but does have significant muscle mass in her legs, attributed to cycling 75-80 miles/week and leg press regularly over 200 pounds.  We discussed current FDA approved guidelines for TIF with regards to BMI.  However, as her weight seems to be much more related to lower extremity muscle mass wrap and central adiposity, feel that it is not unreasonable to offer TIF in a somewhat off label fashion.  After long discussion of the risks, benefits, alternatives, she very strongly prefers laparoscopic hiatal hernia repair with concomitant TIF.  -Referral to Dr. Redmond Pulling at [facility] [place] [facility] for consideration of laparoscopic hiatal hernia repair/cTIF -Referral for preoperative Esophageal Manometry -Discussed the postoperative dietary/activity restrictions and provided with detailed handout with instructions today -Placed refill for Dexilant -Continue current PPI and antireflux lifestyle/dietary modifications -Continue active lifestyle with regular exercise.  Applauded her on her weight loss and continued exercise pursuits  I spent 45 minutes of time, including in depth chart review,  independent review of results as outlined above, communicating results with the patient directly, face-to-face time with the patient, coordinating care, ordering studies and medications as appropriate, and documentation.            [name] ,DO, FACG 09/07/2020, 8:44 AM

## 2020-09-07 NOTE — Patient Instructions (Signed)
If you are age 36 or older, your body mass index should be between 23-30. Your Body mass index is 39.73 kg/m. If this is out of the aforementioned range listed, please consider follow up with your Primary Care Provider.  If you are age 66 or younger, your body mass index should be between 19-25. Your Body mass index is 39.73 kg/m. If this is out of the aformentioned range listed, please consider follow up with your Primary Care Provider.   We are referring you to [facility] to see Dr Greer Pickerel. They will contact you with an appointment. The day of your consultation be sure to bring  a list of current medications, including any over the counter medications or vitamins. Also bring your co-pay if you have one as well as your insurance cards. [facility] is located at [address], [place].  I will contact you this afternoon with the time for your Manometry. You have been scheduled for an esophageal manometry at [facility] on _____________ at ________. Please arrive 30 minutes prior to your procedure for registration. You will need to go to outpatient registration (1st floor of the hospital) first. Make certain to bring your insurance cards as well as a complete list of medications.  Please remember the following:  1) Do not take any muscle relaxants, xanax (alprazolam) or ativan for 1 day prior to your test as well as the day of the test.  2) Nothing to eat or drink for 4 hours before your test.  3) Hold all diabetic medications/insulin the morning of the test. You may eat and take your medications after the test.  It will take at least 2 weeks to receive the results of this test from your physician. ------------------------------------------ ABOUT ESOPHAGEAL MANOMETRY Esophageal manometry (muh-NOM-uh-tree) is a test that gauges how well your esophagus works. Your esophagus is the long, muscular tube that connects your throat to  your stomach. Esophageal manometry measures the rhythmic muscle contractions (peristalsis) that occur in your esophagus when you swallow. Esophageal manometry also measures the coordination and force exerted by the muscles of your esophagus.  During esophageal manometry, a thin, flexible tube (catheter) that contains sensors is passed through your nose, down your esophagus and into your stomach. Esophageal manometry can be helpful in diagnosing some mostly uncommon disorders that affect your esophagus.  Why it's done Esophageal manometry is used to evaluate the movement (motility) of food through the esophagus and into the stomach. The test measures how well the circular bands of muscle (sphincters) at the top and bottom of your esophagus open and close, as well as the pressure, strength and pattern of the wave of esophageal muscle contractions that moves food along.  What you can expect Esophageal manometry is an outpatient procedure done without sedation. Most people tolerate it well. You may be asked to change into a hospital gown before the test starts.  During esophageal manometry  . While you are sitting up, a member of your health care team sprays your throat with a numbing medication or puts numbing gel in your nose or both.  . A catheter is guided through your nose into your esophagus. The catheter may be sheathed in a water-filled sleeve. It doesn't interfere with your breathing. However, your eyes may water, and you may gag. You may have a slight nosebleed from irritation.  . After the catheter is in place, you may be asked to lie on your back on  an exam table, or you may be asked to remain seated.  . You then swallow small sips of water. As you do, a computer connected to the catheter records the pressure, strength and pattern of your esophageal muscle contractions.  . During the test, you'll be asked to breathe slowly and smoothly, remain as still as possible, and swallow only when you're  asked to do so.  . A member of your health care team may move the catheter down into your stomach while the catheter continues its measurements.  . The catheter then is slowly withdrawn. The test usually lasts 20 to 30 minutes.  After esophageal manometry  When your esophageal manometry is complete, you may return to your normal activities  This test typically takes 30-45 minutes to complete. ________________________________________________________________________________   We have sent the following medications to your pharmacy for you to pick up at your convenience:  Dexilant 60mg    Due to recent changes in healthcare laws, you may see the results of your imaging and laboratory studies on MyChart before your provider has had a chance to review them.  We understand that in some cases there may be results that are confusing or concerning to you. Not all laboratory results come back in the same time frame and the provider may be waiting for multiple results in order to interpret others.  Please give [place] 48 hours in order for your provider to thoroughly review all the results before contacting the office for clarification of your results.   Thank you for choosing me and [facility].  Vito Cirigliano, D.O.

## 2020-09-08 DIAGNOSIS — H6502 Acute serous otitis media, left ear: Secondary | ICD-10-CM | POA: Diagnosis not present

## 2020-09-08 DIAGNOSIS — R030 Elevated blood-pressure reading, without diagnosis of hypertension: Secondary | ICD-10-CM | POA: Diagnosis not present

## 2020-09-08 DIAGNOSIS — H7403 Tympanosclerosis, bilateral: Secondary | ICD-10-CM | POA: Diagnosis not present

## 2020-09-20 ENCOUNTER — Telehealth: Payer: Self-pay | Admitting: Gastroenterology

## 2020-09-20 NOTE — Telephone Encounter (Signed)
Spoke to patient. Her manometry has been moved from 10/05/20 to 10/12/20 covid test 10/10/20. All questions answered. Patient voiced understanding.

## 2020-09-20 NOTE — Telephone Encounter (Signed)
Inbound call from patient requesting to reschedule her appointment on 09/30/20 and procedure at [facility] on 10/05/20 please.  States she will be out of town those days.

## 2020-09-30 ENCOUNTER — Other Ambulatory Visit (HOSPITAL_COMMUNITY): Payer: BC Managed Care – PPO

## 2020-10-08 ENCOUNTER — Other Ambulatory Visit (HOSPITAL_COMMUNITY)
Admission: RE | Admit: 2020-10-08 | Discharge: 2020-10-08 | Disposition: A | Discharge status: Home / Self Care | Payer: BC Managed Care – PPO | Source: Ambulatory Visit | Attending: Gastroenterology | Admitting: Gastroenterology

## 2020-10-08 DIAGNOSIS — U071 COVID-19: Secondary | ICD-10-CM | POA: Diagnosis not present

## 2020-10-08 DIAGNOSIS — Z01812 Encounter for preprocedural laboratory examination: Secondary | ICD-10-CM | POA: Insufficient documentation

## 2020-10-08 LAB — SARS CORONAVIRUS 2 (TAT 6-24 HRS): SARS Coronavirus 2: POSITIVE — AB

## 2020-10-09 NOTE — Progress Notes (Addendum)
Unable to reach anyone through the on-call service, so and in box message was left for Dr. Lucille Passy with + covid results for this pt. The pt is to have surgery on Wed 10/12/20 at [facility] at 12:30pm. The pt has not been notified as the surgeon may need to answer pertinent questions.  These are the current guidelines:  Positive Results for:  Asymptomatic: Procedure postponed and quarantined for 10 days. Symptomatic: Procedure postponed and quarantined for 14 days. Hospitalized with Covid: postponed and quarantined  for 21 days. Immunocompromised: Procedure postponed and quarantine for 20 days.  The pt will not be retested for 90 days from the + result.

## 2020-10-10 ENCOUNTER — Other Ambulatory Visit (HOSPITAL_COMMUNITY): Payer: BC Managed Care – PPO

## 2020-10-11 ENCOUNTER — Other Ambulatory Visit: Payer: Self-pay | Admitting: Gastroenterology

## 2020-10-11 DIAGNOSIS — K449 Diaphragmatic hernia without obstruction or gangrene: Secondary | ICD-10-CM

## 2020-10-11 DIAGNOSIS — K219 Gastro-esophageal reflux disease without esophagitis: Secondary | ICD-10-CM

## 2020-10-11 NOTE — Progress Notes (Signed)
Spoke with patient.  Cancelled manometry for tomorrow, 10/12/2020.  Will contact Dr Luanna Salk Cirigliano's scheduling nurse to let her know.

## 2020-10-12 ENCOUNTER — Encounter (HOSPITAL_COMMUNITY): Admission: RE | Payer: Self-pay | Source: Home / Self Care

## 2020-10-12 ENCOUNTER — Ambulatory Visit (HOSPITAL_COMMUNITY)
Admission: RE | Admit: 2020-10-12 | Payer: BC Managed Care – PPO | Source: Home / Self Care | Admitting: Gastroenterology

## 2020-10-12 SURGERY — MANOMETRY, ESOPHAGUS

## 2020-10-21 DIAGNOSIS — Z13220 Encounter for screening for lipoid disorders: Secondary | ICD-10-CM | POA: Diagnosis not present

## 2020-10-21 DIAGNOSIS — Z0000 Encounter for general adult medical examination without abnormal findings: Secondary | ICD-10-CM | POA: Diagnosis not present

## 2020-10-27 ENCOUNTER — Other Ambulatory Visit (HOSPITAL_COMMUNITY): Payer: BC Managed Care – PPO

## 2020-10-27 NOTE — Progress Notes (Signed)
Pt not tested for covid today(10/27/20) due to pt testing + for covid on 10/08/20(results in Epic). Based on the guidelines the pt is in the 90 day window to not retest. The pt is still expected to quarantine until their procedure. Therefore, the pt can still have the scheduled procedure. The covid appointment will be cancelled.

## 2020-10-31 ENCOUNTER — Ambulatory Visit (HOSPITAL_COMMUNITY)
Admission: RE | Admit: 2020-10-31 | Discharge: 2020-10-31 | Disposition: A | Discharge status: Home / Self Care | Payer: BC Managed Care – PPO | Attending: Gastroenterology | Admitting: Gastroenterology

## 2020-10-31 ENCOUNTER — Encounter (HOSPITAL_COMMUNITY)
Admission: RE | Disposition: A | Discharge status: Home / Self Care | Payer: Self-pay | Source: Home / Self Care | Attending: Gastroenterology

## 2020-10-31 DIAGNOSIS — K219 Gastro-esophageal reflux disease without esophagitis: Secondary | ICD-10-CM

## 2020-10-31 DIAGNOSIS — K449 Diaphragmatic hernia without obstruction or gangrene: Secondary | ICD-10-CM | POA: Diagnosis not present

## 2020-10-31 DIAGNOSIS — Z8616 Personal history of COVID-19: Secondary | ICD-10-CM | POA: Insufficient documentation

## 2020-10-31 HISTORY — PX: ESOPHAGEAL MANOMETRY: SHX5429

## 2020-10-31 SURGERY — MANOMETRY, ESOPHAGUS

## 2020-10-31 MED ORDER — LIDOCAINE VISCOUS HCL 2 % MT SOLN
OROMUCOSAL | Status: AC
  Filled 2020-10-31: qty 15

## 2020-10-31 SURGICAL SUPPLY — 2 items
FACESHIELD LNG OPTICON STERILE (SAFETY) IMPLANT
GLOVE BIO SURGEON STRL SZ8 (GLOVE) ×4 IMPLANT

## 2020-10-31 NOTE — Progress Notes (Signed)
Esophageal manometry performed per protocol.  Patient tolerated well.  Report to be sent to Dr. Kavitha Nandigam 

## 2020-11-01 ENCOUNTER — Encounter (HOSPITAL_COMMUNITY): Payer: Self-pay | Admitting: Gastroenterology

## 2020-11-10 DIAGNOSIS — K219 Gastro-esophageal reflux disease without esophagitis: Secondary | ICD-10-CM

## 2020-12-23 DIAGNOSIS — J302 Other seasonal allergic rhinitis: Secondary | ICD-10-CM | POA: Diagnosis not present

## 2020-12-23 DIAGNOSIS — L509 Urticaria, unspecified: Secondary | ICD-10-CM | POA: Diagnosis not present

## 2021-01-04 DIAGNOSIS — K219 Gastro-esophageal reflux disease without esophagitis: Secondary | ICD-10-CM | POA: Diagnosis not present

## 2021-01-04 DIAGNOSIS — E669 Obesity, unspecified: Secondary | ICD-10-CM | POA: Diagnosis not present

## 2021-01-04 DIAGNOSIS — K449 Diaphragmatic hernia without obstruction or gangrene: Secondary | ICD-10-CM | POA: Diagnosis not present

## 2021-01-10 ENCOUNTER — Ambulatory Visit: Payer: Self-pay | Admitting: General Surgery

## 2021-01-10 NOTE — H&P (Signed)
Sara Bryant Appointment: 01/04/2021 9:00 AM Location: [facility] Patient #: [id number] DOB: 1985/10/23 Single / Language: Sara Bryant / Race: White Female  History of Present Illness Randall Hiss M. Shalon Councilman MD; 01/10/2021 3:32 PM) The patient is a 35 year old female who presents with gastroesophageal reflux disease. She is referred by Dr Bryan Lemma for evaluation of TIF/hiatal hernia repair. she states that she has longstanding reflux and heartburn. She states that she has been dealing with it since she was 35 years of age. She states that it was her dentist who actually told her she probably had heartburn or reflux issues. She states that her mother really did not believe how severe her heartburn was. She states that she can feel bile coming up in her throat into her mouth. She states that she was refluxing or regurgitating constantly. She would not have any projectile vomit per se. But she states that the bile reflux is so bad it was affecting her enamel on her teeth. She then saw Dr. Oletta Lamas who diagnosed her with hiatal hernia and esophagitis. She was ultimately put on twice daily PPI and is currently on Dexilant. She states that she was on Nexium for 14 to 15 years. She states that her symptoms are excellent controlled with Dexilant. She states that she really has no symptoms. She is taking it once a day. She denies any early satiety. She denies any dysphagia. She has had a laparoscopic cholecystectomy. She does not smoke. She lifts weight. She also cycles 75 to 80 miles per week. She does not have burping or belching. She has a daily bowel movement.  She states that she woke up during her gallbladder surgery. She is very concerned about anesthesia awareness during surgery. She is a Scientist, clinical (histocompatibility and immunogenetics).   GERD history: -Index symptoms: Regurgitation -Exacerbating features: Post prandial independent of food types -Medications trialed: Zantac, Nexium, Prilosec, pantoprazole,  Pepcid -Current medications: Dexilant 60 mg/day; MVI with calcium/vitamin D -Complications: Hiatal hernia, enamel erosion  GERD evaluation: -Last EGD: 06/2020 -Barium esophagram: None -Esophageal Manometry: She had manometry in February 2022. No manometry evidence of hiatal hernia. Basal lower esophageal sphincter pressure 36.9. DCI 3466. She had normal resting EG junction pressure with complete relaxation. Esophageal contractions were 100%. Normal basal and residual upper esophageal pressures. Complete bolus clearance on 10 out of 10 swallows. Normal relaxation of contractile phase with multiple rapid swallows. No significant esophageal peristaltic abnormality -pH/Impedance: reports this was done ~1999/2000. No report for review. -Bravo: 06/2020: Increase esophageal acid exposure with % time pH<4 5.0%. DeMeester score 18.8 (normal <14.72). Esophageal exposure is abnormal in supine position. No symptoms reported for symptom correlation with reflux events based on SAP. C/w significant gastroesophageal reflux -GES: 03/2001: Normal  Endoscopic History: -EGD (06/2020, Dr. Bryan Lemma): Hill grade 3 valve, 1-2 cm axial height x 3 cm transverse width hiatal hernia, fundic gland polyps. Bravo placed -EGD (04/2017, Dr. Oletta Lamas): Fundic gland polyps, medium sized hiatal hernia, no Barrett's -EGD (2010, Dr. Oletta Lamas): Normal esophagus, normal small bowel with biopsies negative for Celiac -EGD (2000): Esophagitis per patient. No report for review   Problem List/Past Medical Randall Hiss M. Redmond Pulling, MD; 01/10/2021 3:36 PM) OBESITY (BMI 30-39.9) (E66.9) HIATAL HERNIA WITH GERD (K44.9, K21.9)  Past Surgical History Janeann Forehand, CNA; 01/04/2021 9:01 AM) Gallbladder Surgery - Laparoscopic  Diagnostic Studies History Janeann Forehand, CNA; 01/04/2021 9:01 AM) Colonoscopy never  Allergies Janeann Forehand, CNA; 01/04/2021 9:01 AM) levOCARNitine Fumarate *NUTRIENTS* Allergies  Reconciled  Medication History Janeann Forehand, CNA; 01/04/2021 9:01 AM)  Albuterol Sulfate HFA (108 (90 Base)MCG/ACT Aerosol Soln, Inhalation) Active. Amoxicillin-Pot Clavulanate (875-125MG  Tablet, Oral) Active. Dexilant (60MG  Capsule DR, Oral) Active. Dulera (200-5MCG/ACT Aerosol, Inhalation) Active. Junel FE 1.5/30 (1.5-30MG -MCG Tablet, Oral) Active. methylPREDNISolone (4MG  Tab Ther Pack, Oral) Active. Montelukast Sodium (10MG  Tablet, Oral) Active. Pantoprazole Sodium (40MG  Tablet DR, Oral) Active. Medications Reconciled  Social History Janeann Forehand, CNA; 01/04/2021 9:01 AM) No alcohol use Tobacco use Never smoker.  Pregnancy / Birth History Janeann Forehand, CNA; 01/04/2021 9:01 AM) Contraceptive History Oral contraceptives. Gravida 0     Review of Systems Randall Hiss M. Kanika Bungert MD; 01/10/2021 3:23 PM) General Not Present- Appetite Loss, Chills, Fatigue, Fever, Night Sweats, Weight Gain and Weight Loss. Musculoskeletal Not Present- Back Pain, Joint Pain, Joint Stiffness, Muscle Pain, Muscle Weakness and Swelling of Extremities. Neurological Not Present- Decreased Memory, Fainting, Headaches, Numbness, Seizures, Tingling, Tremor, Trouble walking and Weakness. Psychiatric Not Present- Anxiety, Bipolar, Change in Sleep Pattern, Depression, Fearful and Frequent crying. All other systems negative  Vitals Adriana Reams Alston CNA; 01/04/2021 9:02 AM) 01/04/2021 9:01 AM Weight: 224.5 lb Height: 62in Body Surface Area: 2.01 m Body Mass Index: 41.06 kg/m  Temp.: 94.51F  Pulse: 110 (Regular)  P.OX: 98% (Room air) BP: 130/80(Sitting, Left Arm, Standard)        Physical Exam Randall Hiss M. Wen Merced MD; 01/10/2021 3:23 PM)  General Mental Status-Alert. General Appearance-Consistent with stated age. Hydration-Well hydrated. Voice-Normal.  Head and Neck Head-normocephalic, atraumatic with no lesions or palpable masses. Trachea-midline. Thyroid Gland  Characteristics - normal size and consistency.  Eye Eyeball - Bilateral-Normal. Sclera/Conjunctiva - Bilateral-No scleral icterus.  Chest and Lung Exam Chest and lung exam reveals -quiet, even and easy respiratory effort with no use of accessory muscles and on auscultation, normal breath sounds, no adventitious sounds and normal vocal resonance. Inspection Chest Wall - Normal. Back - normal.  Breast - Did not examine.  Cardiovascular Cardiovascular examination reveals -normal heart sounds, regular rate and rhythm with no murmurs and normal pedal pulses bilaterally.  Abdomen Inspection Inspection of the abdomen reveals - No Hernias. Skin - Scar - Note: well healed trocar scars. Palpation/Percussion Palpation and Percussion of the abdomen reveal - Soft, Non Tender, No Rebound tenderness, No Rigidity (guarding) and No hepatosplenomegaly. Auscultation Auscultation of the abdomen reveals - Bowel sounds normal.  Peripheral Vascular Upper Extremity Palpation - Pulses bilaterally normal.  Neurologic Neurologic evaluation reveals -alert and oriented x 3 with no impairment of recent or remote memory. Mental Status-Normal.  Neuropsychiatric The patient's mood and affect are described as -normal. Judgment and Insight-insight is appropriate concerning matters relevant to self.  Musculoskeletal Normal Exam - Left-Upper Extremity Strength Normal and Lower Extremity Strength Normal. Normal Exam - Right-Upper Extremity Strength Normal and Lower Extremity Strength Normal.  Lymphatic Head & Neck  General Head & Neck Lymphatics: Bilateral - Description - Normal. Axillary - Did not examine. Femoral & Inguinal - Did not examine.    Assessment & Plan Randall Hiss M. Aundraya Dripps MD; 01/10/2021 3:36 PM)  HIATAL HERNIA WITH GERD (K44.9) Impression: I had a long conversation with her regarding GERD and hiatal hernia. She is very well-informed and has done a lot of research. She is  interested in coming off medications because of the long-term issues with PPI. Since she does have excellent symptom control with Dexilant we discussed that this generally suggests a good outcome with hiatal hernia repair and fundoplication. Using educational booklet I discussed the anatomy, her work-up to date. I discussed the steps of hiatal hernia repair. We discussed reducing  the stomach, reducing and excising any hernia sac, closing the diaphragm. We then discussed gastroenterology's portion of the procedure briefly which would involve transoral incision less fundoplication. We discussed the typical hospitalization and the typical recovery. We discussed the diet transition from liquids to pured to soft foods to regular foods. We discussed the possibility of still needing heartburn medication. We discussed risk and benefits including but not limited to bleeding, infection, injury to surrounding structures, blood clot formation, hiatal hernia recurrence, need for additional procedures, dysphagia, gas bloat, slipped wrap. We also addressed her concerns about anesthesia awareness. All of her questions were asked and answered. She would like to proceed with concomitant TIF and hiatal hernia repair  This patient encounter took 46 minutes on day of visit to perform the following: take history, perform exam, review outside records, interpret imaging, counsel the patient on their diagnosis  Current Plans Pt Education - Pamphlet Given - Gastroesophagel Reflux Disease: discussed with patient and provided information. You are being scheduled for surgery- Our schedulers will call you.  call us at the end of the month to start the scheduling process  If you have not heard from our office [phone]) in 20 working days, call the office and ask for your surgeon's nurse.  If you have other questions about your diagnosis, plan, or surgery, call the office and ask for your surgeon's nurse.   OBESITY (BMI  30-39.9) (E66.9) Impression: We did discuss that while her BMI is around 40 this does statistically increase her risk of hiatal hernia recurrence. However most of her weight appears to be in her hips and thighs. 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 She does not appear to have a lot of visceral adiposity so I do not think it is unreasonable to offer her laparoscopic hiatal hernia repair.  Leighton Ruff. Redmond Pulling, MD, FACS General, Bariatric, & Minimally Invasive Surgery [facility], [place]

## 2021-01-11 ENCOUNTER — Telehealth: Payer: Self-pay | Admitting: Gastroenterology

## 2021-01-11 NOTE — Telephone Encounter (Signed)
Contacted the patient and she advised me that her insurance company needs to have PA for generic dexilant. Explained to the patient that the rx was sent to [facility] as generic back in Dec 2021. Patient states she has been using a coupon card for this medication until this month and now it is 80.00 with out a PA. Patient picked up 30 day supply today. Will send PA to covermy meds.

## 2021-01-11 NOTE — Telephone Encounter (Signed)
Inbound call from patient requesting a prior authorization for Dexilant for insurance to cover.

## 2021-01-13 ENCOUNTER — Telehealth: Payer: Self-pay

## 2021-01-13 NOTE — Telephone Encounter (Signed)
Spoke with Caryl Pina at [employer], we discussed adding patient on for 03/09/21 for cTIF with Dr. Redmond Pulling starting at 64 AM and Dr. Bryan Lemma to follow at 56 PM.  Dr. Bryan Lemma has agreed that this day and time will work for him.

## 2021-01-17 NOTE — Telephone Encounter (Signed)
Called [place] at [place] to confirm date. Her vm is full and I am unable to leave a message at this time.

## 2021-01-18 NOTE — Telephone Encounter (Signed)
Spoke with Ashely at [place], she states that patient can't do anything until July. Caryl Pina does not have July schedule at this time but will contact me once she does.

## 2021-01-18 NOTE — Telephone Encounter (Signed)
Lm on vm for Ashley at [place] to confirm if we can schedule patient for 03/09/21 as outlined below.

## 2021-01-20 NOTE — Telephone Encounter (Signed)
PA sent to cover my meds

## 2021-01-26 NOTE — Telephone Encounter (Signed)
BC/BS denied Dexilant- they sent a PPI prior review form to be completed by our office, form completed andfaxed to [place]

## 2021-01-26 NOTE — Telephone Encounter (Signed)
Patient called and said [facility] she charging her a lot of money for the planned procedure and she can not do it please advise on what the next step will be.

## 2021-01-30 NOTE — Telephone Encounter (Signed)
Lm on vm for patient to return call 

## 2021-01-30 NOTE — Telephone Encounter (Signed)
Spoke with patient in regards to recommendations. Patient states that she doesn't have a choice but to manage it medically because she can't afford $700 upfront for the procedure. She also states that she received a call from the insurance company over the weekend stating that the [place] has been denied and states "so all medical management options will be out of the door". Advised that Olivia Mackie has completed a PPI prior review form and once she has a response we will let her know. Patient verbalized understanding.

## 2021-02-14 ENCOUNTER — Telehealth: Payer: Self-pay | Admitting: Gastroenterology

## 2021-02-14 NOTE — Telephone Encounter (Signed)
Inbound call from patient regarding Dexilant. Asks if she can be prescribed something else because insurance will not pay for the generic brand which would only cost $10-$15/m b ut will cover the name brand which OOP would be $80/m and patient can not afford that cost. Best contact number [phone]

## 2021-02-14 NOTE — Telephone Encounter (Signed)
Contacted the patient regarding her Dexilant prescription. She stated she received something from BC/BS denying the generic dexilant as well. Cristel will call back tomorrow and give me some names of medications the insurance states they will cover instead.

## 2021-02-15 MED ORDER — RABEPRAZOLE SODIUM 20 MG PO TBEC: 20.0000 mg | DELAYED_RELEASE_TABLET | Freq: Two times a day (BID) | ORAL | 2 refills | Status: DC

## 2021-02-15 NOTE — Telephone Encounter (Signed)
Per converstion with Dr Bryan Lemma, send out rx for rabeprozole 20mg  bid

## 2021-02-15 NOTE — Telephone Encounter (Signed)
Spoke with Sara Bryant this morning and she gave me two medicatons that her insurance would like her to try: Rabepazole and pantoprazole. I will contact the insurance company after speaking with Dr Bryan Lemma about which rx to prescribe. The patient states she has failed pantoprazole, which this is noted in her chart.

## 2021-02-17 ENCOUNTER — Telehealth: Payer: Self-pay | Admitting: Gastroenterology

## 2021-02-17 NOTE — Telephone Encounter (Signed)
Okay to place referral to Dr. Dema Severin per patient request for second surgical opinion.

## 2021-02-17 NOTE — Telephone Encounter (Signed)
Referral, records, demographic, and insurance information faxed to Dr. Justus Memory at [facility]. Patient is aware and had no further concerns at the end of the call.

## 2021-02-17 NOTE — Telephone Encounter (Signed)
Lm on vm for patient to return call 

## 2021-02-17 NOTE — Telephone Encounter (Signed)
Inbound call from patient returning your call. 

## 2021-02-17 NOTE — Telephone Encounter (Signed)
Spoke with patient, she states that since she is unable to proceed with her surgery with [facility] she would like a referral to Dr. Justus Memory at [facility] [place] (P: [phone], [phone]). Please advise, thanks.

## 2021-02-28 DIAGNOSIS — K219 Gastro-esophageal reflux disease without esophagitis: Secondary | ICD-10-CM | POA: Diagnosis not present

## 2021-02-28 DIAGNOSIS — K449 Diaphragmatic hernia without obstruction or gangrene: Secondary | ICD-10-CM | POA: Diagnosis not present

## 2021-03-31 ENCOUNTER — Telehealth: Payer: Self-pay

## 2021-03-31 NOTE — Telephone Encounter (Signed)
Received a call from [place] at [place], patient told Caryl Pina that she got a new job and will be able to afford the payment now. We have scheduled patient for cTIF at [facility] on Thursday, 06/08/21 at 7:30 AM. Dr. Bryan Lemma will follow at 8:30 AM. Letter mailed to patient with post-TIF diet and 2 week post-TIF follow up appt which is Tuesday, 06/20/21 at 8:40 am. Patient is aware of all information and had no concerns at the end of the call.

## 2021-04-03 ENCOUNTER — Other Ambulatory Visit: Payer: Self-pay

## 2021-04-03 DIAGNOSIS — K219 Gastro-esophageal reflux disease without esophagitis: Secondary | ICD-10-CM

## 2021-04-03 DIAGNOSIS — K449 Diaphragmatic hernia without obstruction or gangrene: Secondary | ICD-10-CM

## 2021-05-10 ENCOUNTER — Other Ambulatory Visit: Payer: Self-pay | Admitting: Gastroenterology

## 2021-05-11 DIAGNOSIS — J3489 Other specified disorders of nose and nasal sinuses: Secondary | ICD-10-CM | POA: Diagnosis not present

## 2021-06-01 NOTE — Patient Instructions (Signed)
DUE TO COVID-19 ONLY ONE VISITOR IS ALLOWED TO COME WITH YOU AND STAY IN THE WAITING ROOM ONLY DURING PRE OP AND PROCEDURE DAY OF SURGERY IF YOU ARE GOING HOME AFTER SURGERY. IF YOU ARE SPENDING THE NIGHT 2 PEOPLE MAY VISIT WITH YOU IN YOUR PRIVATE ROOM AFTER SURGERY UNTIL VISITING  HOURS ARE OVER AT 800 PM AND THE 2 VISITORS CANNOT SPEND THE NIGHT.               Sara Bryant     Your procedure is scheduled on: 06/08/21   Report to [facility] Main  Entrance   Report to short stay at 5:15 AM     Call this number if you have problems the morning of surgery [phone]   No food after midnight.    You may have clear liquid until 4:30 AM.    At 4:00 AM drink pre surgery drink.   Nothing by mouth after 4:30 AM.     CLEAR LIQUID DIET   Foods Allowed                                                                     Foods Excluded  Plain Jell-O any favor except red or purple                                           see through such as: Fruit ices (not with fruit pulp)                                     milk, soups, orange juice  Iced Popsicles                                    All solid food Carbonated beverages, regular and diet                                    Cranberry, grape and apple juices Sports drinks like Gatorade Lightly seasoned clear broth or consume(fat free) Sugar     BRUSH YOUR TEETH MORNING OF SURGERY AND RINSE YOUR MOUTH OUT, NO CHEWING GUM CANDY OR MINTS.     Take these medicines the morning of surgery with A SIP OF WATER: Use inhalers and bring with you. Take contraceptive and Rabeprazole                                You may not have any metal on your body including hair pins and              piercings  Do not wear jewelry, make-up, lotions, powders or perfumes, deodorant             Do not wear nail polish on your fingernails.  Do not shave  48 hours prior to surgery.  Do not bring valuables to the hospital. [place].  Contacts, dentures or bridgework may not be worn into surgery.       Patients discharged the day of surgery will not be allowed to drive home.   IF YOU ARE HAVING SURGERY AND GOING HOME THE SAME DAY, YOU MUST HAVE AN ADULT TO DRIVE YOU HOME AND BE WITH YOU FOR 24 HOURS.  YOU MAY GO HOME BY TAXI OR UBER OR ORTHERWISE, BUT AN ADULT MUST ACCOMPANY YOU HOME AND STAY WITH YOU FOR 24 HOURS.  Name and phone number of your driver:  Special Instructions: N/A              Please read over the following fact sheets you were given: _____________________________________________________________________             [facility] - Preparing for Surgery Before surgery, you can play an important role.  Because skin is not sterile, your skin needs to be as free of germs as possible.  You can reduce the number of germs on your skin by washing with CHG (chlorahexidine gluconate) soap before surgery.  CHG is an antiseptic cleaner which kills germs and bonds with the skin to continue killing germs even after washing. Please DO NOT use if you have an allergy to CHG or antibacterial soaps.  If your skin becomes reddened/irritated stop using the CHG and inform your nurse when you arrive at Short Stay. Do not shave (including legs and underarms) for at least 48 hours prior to the first CHG shower.   Please follow these instructions carefully:  1.  Shower with CHG Soap the night before surgery and the  morning of Surgery.  2.  If you choose to wash your hair, wash your hair first as usual with your  normal  shampoo.  3.  After you shampoo, rinse your hair and body thoroughly to remove the  shampoo.                            4.  Use CHG as you would any other liquid soap.  You can apply chg directly  to the skin and wash                       Gently with a scrungie or clean washcloth.  5.  Apply the CHG Soap to your body ONLY FROM THE NECK DOWN.   Do not use on face/  open                           Wound or open sores. Avoid contact with eyes, ears mouth and genitals (private parts).                       Wash face,  Genitals (private parts) with your normal soap.             6.  Wash thoroughly, paying special attention to the area where your surgery  will be performed.  7.  Thoroughly rinse your body with warm water from the neck down.  8.  DO NOT shower/wash with your normal soap after using and rinsing off  the CHG Soap.  9.  Pat yourself dry with a clean towel.            10.  Wear clean pajamas.            11.  Place clean sheets on your bed the night of your first shower and do not  sleep with pets. Day of Surgery : Do not apply any lotions/deodorants the morning of surgery.  Please wear clean clothes to the hospital/surgery center.  FAILURE TO FOLLOW THESE INSTRUCTIONS MAY RESULT IN THE CANCELLATION OF YOUR SURGERY PATIENT SIGNATURE_________________________________  NURSE SIGNATURE__________________________________  ________________________________________________________________________

## 2021-06-02 ENCOUNTER — Other Ambulatory Visit: Payer: Self-pay

## 2021-06-02 ENCOUNTER — Encounter (HOSPITAL_COMMUNITY): Payer: Self-pay

## 2021-06-02 ENCOUNTER — Encounter (HOSPITAL_COMMUNITY)
Admission: RE | Admit: 2021-06-02 | Discharge: 2021-06-02 | Disposition: A | Discharge status: Home / Self Care | Payer: BC Managed Care – PPO | Source: Ambulatory Visit | Attending: General Surgery | Admitting: General Surgery

## 2021-06-02 DIAGNOSIS — Z01812 Encounter for preprocedural laboratory examination: Secondary | ICD-10-CM | POA: Diagnosis not present

## 2021-06-02 HISTORY — DX: Personal history of other diseases of the digestive system: Z87.19

## 2021-06-02 LAB — COMPREHENSIVE METABOLIC PANEL
ALT: 25 U/L (ref 0–44)
AST: 25 U/L (ref 15–41)
Albumin: 3.7 g/dL (ref 3.5–5.0)
Alkaline Phosphatase: 62 U/L (ref 38–126)
Anion gap: 9 (ref 5–15)
BUN: 11 mg/dL (ref 6–20)
CO2: 25 mmol/L (ref 22–32)
Calcium: 9.3 mg/dL (ref 8.9–10.3)
Chloride: 106 mmol/L (ref 98–111)
Creatinine, Ser: 0.77 mg/dL (ref 0.44–1.00)
GFR, Estimated: >60 mL/min (ref >60)
Glucose, Bld: 85 mg/dL (ref 70–99)
Potassium: 4.5 mmol/L (ref 3.5–5.1)
Sodium: 140 mmol/L (ref 135–145)
Total Bilirubin: 0.9 mg/dL (ref 0.3–1.2)
Total Protein: 7.7 g/dL (ref 6.5–8.1)

## 2021-06-02 LAB — CBC WITH DIFFERENTIAL/PLATELET
Abs Immature Granulocytes: 0.03 10*3/uL (ref 0.00–0.07)
Basophils Absolute: 0.1 10*3/uL (ref 0.0–0.1)
Basophils Relative: 1 %
Eosinophils Absolute: 0.2 10*3/uL (ref 0.0–0.5)
Eosinophils Relative: 3 %
HCT: 45.8 % (ref 36.0–46.0)
Hemoglobin: 14.7 g/dL (ref 12.0–15.0)
Immature Granulocytes: 0 %
Lymphocytes Relative: 45 %
Lymphs Abs: 3.2 10*3/uL (ref 0.7–4.0)
MCH: 29 pg (ref 26.0–34.0)
MCHC: 32.1 g/dL (ref 30.0–36.0)
MCV: 90.3 fL (ref 80.0–100.0)
Monocytes Absolute: 0.8 10*3/uL (ref 0.1–1.0)
Monocytes Relative: 10 %
Neutro Abs: 3 10*3/uL (ref 1.7–7.7)
Neutrophils Relative %: 41 %
Platelets: 280 10*3/uL (ref 150–400)
RBC: 5.07 MIL/uL (ref 3.87–5.11)
RDW: 13.4 % (ref 11.5–15.5)
WBC: 7.3 10*3/uL (ref 4.0–10.5)
nRBC: 0 % (ref 0.0–0.2)

## 2021-06-02 NOTE — Progress Notes (Signed)
COVID test Completed: NA . Pt is listed as ambulatory  PCP - Dr. Lavina Hamman LOV 10/21/19-epic Cardiologist - none  Chest x-ray - no EKG - no Stress Test - no ECHO - no Cardiac Cath - no Pacemaker/ICD device last checked:NA  Sleep Study -no  CPAP -   Fasting Blood Sugar - NA Checks Blood Sugar _____ times a day  Blood Thinner Instructions:NA Aspirin Instructions: Last Dose:  Anesthesia review: no  Patient denies shortness of breath, fever, cough and chest pain at PAT appointment Pt reports no SOB with any activities. She rarely uses rescue inhalers only.  Patient verbalized understanding of instructions that were given to them at the PAT appointment. Patient was also instructed that they will need to review over the PAT instructions again at home before surgery. yes

## 2021-06-07 ENCOUNTER — Encounter (HOSPITAL_COMMUNITY): Payer: Self-pay | Admitting: General Surgery

## 2021-06-07 NOTE — Anesthesia Preprocedure Evaluation (Addendum)
Anesthesia Evaluation  Patient identified by MRN, date of birth, ID band Patient awake    Reviewed: Allergy & Precautions, NPO status , Patient's Chart, lab work & pertinent test results  Airway Mallampati: III  TM Distance: >3 FB Neck ROM: Full    Dental  (+) Teeth Intact, Dental Advisory Given   Pulmonary asthma ,    breath sounds clear to auscultation       Cardiovascular negative cardio ROS   Rhythm:Regular Rate:Normal     Neuro/Psych negative neurological ROS  negative psych ROS   GI/Hepatic Neg liver ROS, hiatal hernia, GERD  ,  Endo/Other  negative endocrine ROS  Renal/GU negative Renal ROS     Musculoskeletal negative musculoskeletal ROS (+)   Abdominal Normal abdominal exam  (+)   Peds  Hematology negative hematology ROS (+)   Anesthesia Other Findings   Reproductive/Obstetrics                            Anesthesia Physical Anesthesia Plan  ASA: 3  Anesthesia Plan: General   Post-op Pain Management:    Induction: Intravenous  PONV Risk Score and Plan: 4 or greater and Ondansetron, Dexamethasone, Midazolam and Scopolamine patch - Pre-op  Airway Management Planned: Oral ETT  Additional Equipment: None  Intra-op Plan:   Post-operative Plan: Extubation in OR  Informed Consent: I have reviewed the patients History and Physical, chart, labs and discussed the procedure including the risks, benefits and alternatives for the proposed anesthesia with the patient or authorized representative who has indicated his/her understanding and acceptance.     Dental advisory given  Plan Discussed with: CRNA  Anesthesia Plan Comments:        Anesthesia Quick Evaluation

## 2021-06-08 ENCOUNTER — Encounter (HOSPITAL_COMMUNITY): Payer: Self-pay | Admitting: General Surgery

## 2021-06-08 ENCOUNTER — Other Ambulatory Visit: Payer: Self-pay

## 2021-06-08 ENCOUNTER — Ambulatory Visit (HOSPITAL_COMMUNITY): Payer: BC Managed Care – PPO | Admitting: Anesthesiology

## 2021-06-08 ENCOUNTER — Observation Stay (HOSPITAL_COMMUNITY)
Admission: RE | Admit: 2021-06-08 | Discharge: 2021-06-09 | Disposition: A | Discharge status: Home / Self Care | Payer: BC Managed Care – PPO | Attending: General Surgery | Admitting: General Surgery

## 2021-06-08 ENCOUNTER — Encounter (HOSPITAL_COMMUNITY)
Admission: RE | Disposition: A | Discharge status: Home / Self Care | Payer: Self-pay | Source: Home / Self Care | Attending: General Surgery

## 2021-06-08 DIAGNOSIS — K449 Diaphragmatic hernia without obstruction or gangrene: Secondary | ICD-10-CM | POA: Diagnosis not present

## 2021-06-08 DIAGNOSIS — K219 Gastro-esophageal reflux disease without esophagitis: Secondary | ICD-10-CM

## 2021-06-08 DIAGNOSIS — K317 Polyp of stomach and duodenum: Secondary | ICD-10-CM | POA: Diagnosis not present

## 2021-06-08 DIAGNOSIS — J45909 Unspecified asthma, uncomplicated: Secondary | ICD-10-CM | POA: Insufficient documentation

## 2021-06-08 DIAGNOSIS — J309 Allergic rhinitis, unspecified: Secondary | ICD-10-CM | POA: Diagnosis not present

## 2021-06-08 DIAGNOSIS — R911 Solitary pulmonary nodule: Secondary | ICD-10-CM | POA: Diagnosis not present

## 2021-06-08 DIAGNOSIS — D131 Benign neoplasm of stomach: Secondary | ICD-10-CM

## 2021-06-08 HISTORY — PX: ESOPHAGOGASTRODUODENOSCOPY: SHX5428

## 2021-06-08 HISTORY — PX: TRANSORAL INCISIONLESS FUNDOPLICATION: SHX6840

## 2021-06-08 HISTORY — PX: HIATAL HERNIA REPAIR: SHX195

## 2021-06-08 LAB — PREGNANCY, URINE: Preg Test, Ur: NEGATIVE

## 2021-06-08 SURGERY — REPAIR, HERNIA, HIATAL, LAPAROSCOPIC
Anesthesia: General | Site: Esophagus

## 2021-06-08 MED ORDER — SUCCINYLCHOLINE CHLORIDE 200 MG/10ML IV SOSY
PREFILLED_SYRINGE | INTRAVENOUS | Status: DC | PRN
Start: 2021-06-08 — End: 2021-06-08
  Administered 2021-06-08: 120 mg via INTRAVENOUS

## 2021-06-08 MED ORDER — EPHEDRINE SULFATE-NACL 50-0.9 MG/10ML-% IV SOSY
PREFILLED_SYRINGE | INTRAVENOUS | Status: DC | PRN
  Administered 2021-06-08: 10 mg via INTRAVENOUS

## 2021-06-08 MED ORDER — MONTELUKAST SODIUM 10 MG PO TABS
10.0000 mg | ORAL_TABLET | Freq: Every day | ORAL | Status: DC
  Administered 2021-06-08: 10 mg via ORAL
  Filled 2021-06-08: qty 1

## 2021-06-08 MED ORDER — OXYCODONE HCL 5 MG PO TABS: 5.0000 mg | ORAL_TABLET | Freq: Once | ORAL | Status: AC | PRN

## 2021-06-08 MED ORDER — DEXAMETHASONE SODIUM PHOSPHATE 10 MG/ML IJ SOLN
INTRAMUSCULAR | Status: AC
  Filled 2021-06-08: qty 1

## 2021-06-08 MED ORDER — LACTATED RINGERS IV SOLN: INTRAVENOUS | Status: DC | PRN

## 2021-06-08 MED ORDER — BUPIVACAINE-EPINEPHRINE 0.25% -1:200000 IJ SOLN
INTRAMUSCULAR | Status: DC | PRN
  Administered 2021-06-08: 30 mL

## 2021-06-08 MED ORDER — SCOPOLAMINE 1 MG/3DAYS TD PT72: 1.0000 | MEDICATED_PATCH | TRANSDERMAL | Status: DC

## 2021-06-08 MED ORDER — LIDOCAINE HCL 2 % IJ SOLN
INTRAMUSCULAR | Status: AC
  Filled 2021-06-08: qty 20

## 2021-06-08 MED ORDER — SCOPOLAMINE 1 MG/3DAYS TD PT72: 1.0000 | MEDICATED_PATCH | Freq: Once | TRANSDERMAL | Status: DC

## 2021-06-08 MED ORDER — OXYCODONE HCL 5 MG/5ML PO SOLN: 5.0000 mg | Freq: Once | ORAL | Status: AC | PRN

## 2021-06-08 MED ORDER — BUPIVACAINE LIPOSOME 1.3 % IJ SUSP
INTRAMUSCULAR | Status: DC | PRN
  Administered 2021-06-08: 20 mL

## 2021-06-08 MED ORDER — KETAMINE HCL 10 MG/ML IJ SOLN
INTRAMUSCULAR | Status: DC | PRN
  Administered 2021-06-08 (×2): 15 mg via INTRAVENOUS

## 2021-06-08 MED ORDER — FENTANYL CITRATE (PF) 100 MCG/2ML IJ SOLN
INTRAMUSCULAR | Status: AC
  Filled 2021-06-08: qty 2

## 2021-06-08 MED ORDER — SODIUM CHLORIDE 0.9 % IV SOLN: INTRAVENOUS | Status: DC

## 2021-06-08 MED ORDER — PANTOPRAZOLE SODIUM 40 MG PO TBEC
40.0000 mg | DELAYED_RELEASE_TABLET | Freq: Two times a day (BID) | ORAL | Status: DC
  Administered 2021-06-08 – 2021-06-09 (×3): 40 mg via ORAL
  Filled 2021-06-08 (×3): qty 1

## 2021-06-08 MED ORDER — LIDOCAINE VISCOUS HCL 2 % MT SOLN
5.0000 mL | Freq: Three times a day (TID) | OROMUCOSAL | Status: DC | PRN
  Filled 2021-06-08: qty 15

## 2021-06-08 MED ORDER — SCOPOLAMINE 1 MG/3DAYS TD PT72
1.0000 | MEDICATED_PATCH | TRANSDERMAL | Status: DC
  Administered 2021-06-08: 1.5 mg via TRANSDERMAL
  Filled 2021-06-08: qty 1

## 2021-06-08 MED ORDER — ACETAMINOPHEN 500 MG PO TABS
1000.0000 mg | ORAL_TABLET | ORAL | Status: AC
  Administered 2021-06-08: 1000 mg via ORAL
  Filled 2021-06-08: qty 2

## 2021-06-08 MED ORDER — BUPIVACAINE LIPOSOME 1.3 % IJ SUSP
INTRAMUSCULAR | Status: AC
  Filled 2021-06-08: qty 20

## 2021-06-08 MED ORDER — LORATADINE 10 MG PO TABS
10.0000 mg | ORAL_TABLET | Freq: Every day | ORAL | Status: DC
  Administered 2021-06-08 – 2021-06-09 (×2): 10 mg via ORAL
  Filled 2021-06-08 (×2): qty 1

## 2021-06-08 MED ORDER — DEXAMETHASONE SODIUM PHOSPHATE 4 MG/ML IJ SOLN: 4.0000 mg | INTRAMUSCULAR | Status: DC

## 2021-06-08 MED ORDER — ONDANSETRON HCL 4 MG/2ML IJ SOLN
4.0000 mg | Freq: Once | INTRAMUSCULAR | Status: AC
Start: 2021-06-08 — End: 2021-06-08
  Administered 2021-06-08: 4 mg via INTRAVENOUS
  Filled 2021-06-08: qty 2

## 2021-06-08 MED ORDER — MOMETASONE FURO-FORMOTEROL FUM 200-5 MCG/ACT IN AERO
2.0000 | INHALATION_SPRAY | Freq: Two times a day (BID) | RESPIRATORY_TRACT | Status: DC | PRN
  Filled 2021-06-08: qty 8.8

## 2021-06-08 MED ORDER — ROCURONIUM BROMIDE 10 MG/ML (PF) SYRINGE
PREFILLED_SYRINGE | INTRAVENOUS | Status: DC | PRN
  Administered 2021-06-08: 70 mg via INTRAVENOUS

## 2021-06-08 MED ORDER — BUPIVACAINE-EPINEPHRINE (PF) 0.25% -1:200000 IJ SOLN
INTRAMUSCULAR | Status: AC
  Filled 2021-06-08: qty 30

## 2021-06-08 MED ORDER — METOCLOPRAMIDE HCL 5 MG/ML IJ SOLN
10.0000 mg | Freq: Four times a day (QID) | INTRAMUSCULAR | Status: DC
  Administered 2021-06-08 – 2021-06-09 (×4): 10 mg via INTRAVENOUS
  Filled 2021-06-08 (×4): qty 2

## 2021-06-08 MED ORDER — ACETAMINOPHEN 10 MG/ML IV SOLN: 1000.0000 mg | Freq: Once | INTRAVENOUS | Status: DC | PRN

## 2021-06-08 MED ORDER — CHLORHEXIDINE GLUCONATE CLOTH 2 % EX PADS: 6.0000 | MEDICATED_PAD | Freq: Once | CUTANEOUS | Status: DC

## 2021-06-08 MED ORDER — SUCCINYLCHOLINE CHLORIDE 200 MG/10ML IV SOSY
PREFILLED_SYRINGE | INTRAVENOUS | Status: AC
  Filled 2021-06-08: qty 10

## 2021-06-08 MED ORDER — ONDANSETRON HCL 4 MG/2ML IJ SOLN
INTRAMUSCULAR | Status: DC | PRN
  Administered 2021-06-08: 4 mg via INTRAVENOUS

## 2021-06-08 MED ORDER — PROPOFOL 10 MG/ML IV BOLUS
INTRAVENOUS | Status: DC | PRN
  Administered 2021-06-08: 200 mg via INTRAVENOUS

## 2021-06-08 MED ORDER — FAMOTIDINE IN NACL 20-0.9 MG/50ML-% IV SOLN
20.0000 mg | Freq: Once | INTRAVENOUS | Status: AC
  Administered 2021-06-08: 20 mg via INTRAVENOUS
  Filled 2021-06-08: qty 50

## 2021-06-08 MED ORDER — SIMETHICONE 80 MG PO CHEW
80.0000 mg | CHEWABLE_TABLET | Freq: Four times a day (QID) | ORAL | Status: DC | PRN
  Administered 2021-06-08: 80 mg via ORAL
  Filled 2021-06-08: qty 1

## 2021-06-08 MED ORDER — CHLORHEXIDINE GLUCONATE 0.12 % MT SOLN
15.0000 mL | Freq: Once | OROMUCOSAL | Status: AC
  Administered 2021-06-08: 15 mL via OROMUCOSAL

## 2021-06-08 MED ORDER — FENTANYL CITRATE PF 50 MCG/ML IJ SOSY
PREFILLED_SYRINGE | INTRAMUSCULAR | Status: AC
  Administered 2021-06-08: 25 ug via INTRAVENOUS
  Filled 2021-06-08: qty 1

## 2021-06-08 MED ORDER — MIDAZOLAM HCL 2 MG/2ML IJ SOLN
INTRAMUSCULAR | Status: AC
  Filled 2021-06-08: qty 2

## 2021-06-08 MED ORDER — LIDOCAINE HCL (PF) 2 % IJ SOLN
INTRAMUSCULAR | Status: AC
  Filled 2021-06-08: qty 5

## 2021-06-08 MED ORDER — ORAL CARE MOUTH RINSE: 15.0000 mL | Freq: Once | OROMUCOSAL | Status: AC

## 2021-06-08 MED ORDER — ENSURE PRE-SURGERY PO LIQD
296.0000 mL | Freq: Once | ORAL | Status: DC
  Filled 2021-06-08: qty 296

## 2021-06-08 MED ORDER — FENTANYL CITRATE PF 50 MCG/ML IJ SOSY
25.0000 ug | PREFILLED_SYRINGE | INTRAMUSCULAR | Status: DC | PRN
  Administered 2021-06-08: 25 ug via INTRAVENOUS

## 2021-06-08 MED ORDER — CEFAZOLIN SODIUM-DEXTROSE 2-4 GM/100ML-% IV SOLN: 2.0000 g | Freq: Once | INTRAVENOUS | Status: DC

## 2021-06-08 MED ORDER — AMISULPRIDE (ANTIEMETIC) 5 MG/2ML IV SOLN: 10.0000 mg | Freq: Once | INTRAVENOUS | Status: DC | PRN

## 2021-06-08 MED ORDER — ONDANSETRON HCL 4 MG/2ML IJ SOLN
INTRAMUSCULAR | Status: AC
  Filled 2021-06-08: qty 2

## 2021-06-08 MED ORDER — ACETAMINOPHEN 325 MG PO TABS: 325.0000 mg | ORAL_TABLET | ORAL | Status: DC | PRN

## 2021-06-08 MED ORDER — ALBUTEROL SULFATE (2.5 MG/3ML) 0.083% IN NEBU: 2.5000 mg | INHALATION_SOLUTION | Freq: Four times a day (QID) | RESPIRATORY_TRACT | Status: DC | PRN

## 2021-06-08 MED ORDER — ACETAMINOPHEN-CODEINE 120-12 MG/5ML PO SOLN
15.0000 mL | ORAL | Status: DC | PRN
  Administered 2021-06-08 – 2021-06-09 (×2): 15 mL via ORAL
  Filled 2021-06-08 (×2): qty 20

## 2021-06-08 MED ORDER — PHENYLEPHRINE HCL (PRESSORS) 10 MG/ML IV SOLN
INTRAVENOUS | Status: AC
  Filled 2021-06-08: qty 2

## 2021-06-08 MED ORDER — DEXAMETHASONE SODIUM PHOSPHATE 10 MG/ML IJ SOLN
INTRAMUSCULAR | Status: DC | PRN
  Administered 2021-06-08: 8 mg via INTRAVENOUS

## 2021-06-08 MED ORDER — OXYCODONE HCL 5 MG PO TABS
ORAL_TABLET | ORAL | Status: AC
  Administered 2021-06-08: 5 mg via ORAL
  Filled 2021-06-08: qty 1

## 2021-06-08 MED ORDER — GABAPENTIN 300 MG PO CAPS
300.0000 mg | ORAL_CAPSULE | ORAL | Status: AC
Start: 2021-06-08 — End: 2021-06-08
  Administered 2021-06-08: 300 mg via ORAL
  Filled 2021-06-08: qty 1

## 2021-06-08 MED ORDER — PHENYLEPHRINE HCL-NACL 20-0.9 MG/250ML-% IV SOLN
INTRAVENOUS | Status: DC | PRN
  Administered 2021-06-08: 25 ug/min via INTRAVENOUS

## 2021-06-08 MED ORDER — SUGAMMADEX SODIUM 200 MG/2ML IV SOLN
INTRAVENOUS | Status: DC | PRN
  Administered 2021-06-08: 200 mg via INTRAVENOUS

## 2021-06-08 MED ORDER — LIDOCAINE 2% (20 MG/ML) 5 ML SYRINGE
INTRAMUSCULAR | Status: DC | PRN
  Administered 2021-06-08: 80 mg via INTRAVENOUS
  Administered 2021-06-08: 1.5 mg/kg/h via INTRAVENOUS

## 2021-06-08 MED ORDER — PROMETHAZINE HCL 25 MG/ML IJ SOLN: 6.2500 mg | INTRAMUSCULAR | Status: DC | PRN

## 2021-06-08 MED ORDER — PROPOFOL 10 MG/ML IV BOLUS
INTRAVENOUS | Status: AC
  Filled 2021-06-08: qty 20

## 2021-06-08 MED ORDER — ROCURONIUM BROMIDE 10 MG/ML (PF) SYRINGE
PREFILLED_SYRINGE | INTRAVENOUS | Status: AC
  Filled 2021-06-08: qty 10

## 2021-06-08 MED ORDER — SODIUM CHLORIDE 0.9 % IR SOLN
Status: DC | PRN
  Administered 2021-06-08: 1000 mL

## 2021-06-08 MED ORDER — CEFAZOLIN SODIUM-DEXTROSE 2-4 GM/100ML-% IV SOLN
2.0000 g | INTRAVENOUS | Status: AC
  Administered 2021-06-08: 2 g via INTRAVENOUS
  Filled 2021-06-08: qty 100

## 2021-06-08 MED ORDER — EPHEDRINE 5 MG/ML INJ
INTRAVENOUS | Status: AC
  Filled 2021-06-08: qty 5

## 2021-06-08 MED ORDER — ONDANSETRON HCL 4 MG/2ML IJ SOLN
4.0000 mg | Freq: Four times a day (QID) | INTRAMUSCULAR | Status: DC
  Administered 2021-06-08 – 2021-06-09 (×4): 4 mg via INTRAVENOUS
  Filled 2021-06-08 (×4): qty 2

## 2021-06-08 MED ORDER — ACETAMINOPHEN 160 MG/5ML PO SOLN: 325.0000 mg | ORAL | Status: DC | PRN

## 2021-06-08 MED ORDER — LACTATED RINGERS IV SOLN
INTRAVENOUS | Status: AC | PRN
  Administered 2021-06-08: 1000 mL

## 2021-06-08 MED ORDER — HEPARIN SODIUM (PORCINE) 5000 UNIT/ML IJ SOLN
5000.0000 [IU] | Freq: Once | INTRAMUSCULAR | Status: AC
  Administered 2021-06-08: 5000 [IU] via SUBCUTANEOUS
  Filled 2021-06-08: qty 1

## 2021-06-08 MED ORDER — KETAMINE HCL 10 MG/ML IJ SOLN
INTRAMUSCULAR | Status: AC
  Filled 2021-06-08: qty 1

## 2021-06-08 MED ORDER — FENTANYL CITRATE (PF) 100 MCG/2ML IJ SOLN
INTRAMUSCULAR | Status: DC | PRN
  Administered 2021-06-08: 50 ug via INTRAVENOUS
  Administered 2021-06-08: 25 ug via INTRAVENOUS
  Administered 2021-06-08 (×2): 50 ug via INTRAVENOUS

## 2021-06-08 MED ORDER — DEXAMETHASONE SODIUM PHOSPHATE 10 MG/ML IJ SOLN
8.0000 mg | Freq: Four times a day (QID) | INTRAMUSCULAR | Status: DC
  Administered 2021-06-08 – 2021-06-09 (×4): 8 mg via INTRAVENOUS
  Filled 2021-06-08 (×4): qty 1

## 2021-06-08 MED ORDER — BUPIVACAINE LIPOSOME 1.3 % IJ SUSP: 20.0000 mL | Freq: Once | INTRAMUSCULAR | Status: DC

## 2021-06-08 MED ORDER — LACTATED RINGERS IV SOLN: INTRAVENOUS | Status: DC

## 2021-06-08 MED ORDER — MIDAZOLAM HCL 5 MG/5ML IJ SOLN
INTRAMUSCULAR | Status: DC | PRN
  Administered 2021-06-08: 2 mg via INTRAVENOUS

## 2021-06-08 SURGICAL SUPPLY — 57 items
APL PRP STRL LF DISP 70% ISPRP (MISCELLANEOUS) ×2
APL SKNCLS STERI-STRIP NONHPOA (GAUZE/BANDAGES/DRESSINGS)
APPLIER CLIP ROT 10 11.4 M/L (STAPLE)
APR CLP MED LRG 11.4X10 (STAPLE)
BAG COUNTER SPONGE SURGICOUNT (BAG) IMPLANT
BAG SPNG CNTER NS LX DISP (BAG)
BENZOIN TINCTURE PRP APPL 2/3 (GAUZE/BANDAGES/DRESSINGS) IMPLANT
CABLE HIGH FREQUENCY MONO STRZ (ELECTRODE) IMPLANT
CHLORAPREP W/TINT 26 (MISCELLANEOUS) ×3 IMPLANT
CLIP APPLIE ROT 10 11.4 M/L (STAPLE) IMPLANT
DECANTER SPIKE VIAL GLASS SM (MISCELLANEOUS) ×3 IMPLANT
DEVICE SUT QUICK LOAD TK 5 (STAPLE) ×2 IMPLANT
DEVICE SUT TI-KNOT TK 5X26 (MISCELLANEOUS) ×1 IMPLANT
DEVICE SUTURE ENDOST 10MM (ENDOMECHANICALS) ×3 IMPLANT
DISSECTOR BLUNT TIP ENDO 5MM (MISCELLANEOUS) ×3 IMPLANT
DRAIN PENROSE 0.5X18 (DRAIN) ×3 IMPLANT
DRSG TEGADERM 2-3/8X2-3/4 SM (GAUZE/BANDAGES/DRESSINGS) ×6 IMPLANT
ELECT L-HOOK LAP 45CM DISP (ELECTROSURGICAL) ×3
ELECT REM PT RETURN 15FT ADLT (MISCELLANEOUS) ×3 IMPLANT
ELECTRODE L-HOOK LAP 45CM DISP (ELECTROSURGICAL) ×2 IMPLANT
GAUZE 4X4 16PLY ~~LOC~~+RFID DBL (SPONGE) ×1 IMPLANT
GAUZE SPONGE 2X2 8PLY STRL LF (GAUZE/BANDAGES/DRESSINGS) IMPLANT
GLOVE SRG 8 PF TXTR STRL LF DI (GLOVE) ×2 IMPLANT
GLOVE SURG POLY ORTHO LF SZ7.5 (GLOVE) ×3 IMPLANT
GLOVE SURG UNDER POLY LF SZ7 (GLOVE) IMPLANT
GLOVE SURG UNDER POLY LF SZ8 (GLOVE) ×3
GOWN STRL REUS W/TWL LRG LVL3 (GOWN DISPOSABLE) ×3 IMPLANT
GOWN STRL REUS W/TWL XL LVL3 (GOWN DISPOSABLE) ×9 IMPLANT
GRASPER SUT TROCAR 14GX15 (MISCELLANEOUS) ×3 IMPLANT
KIT BASIN OR (CUSTOM PROCEDURE TRAY) ×3 IMPLANT
KIT TURNOVER KIT A (KITS) ×3 IMPLANT
NS IRRIG 1000ML POUR BTL (IV SOLUTION) ×3 IMPLANT
PACK UNIVERSAL I (CUSTOM PROCEDURE TRAY) ×3 IMPLANT
PENCIL SMOKE EVACUATOR (MISCELLANEOUS) IMPLANT
SCISSORS LAP 5X45 EPIX DISP (ENDOMECHANICALS) ×3 IMPLANT
SET IRRIG TUBING LAPAROSCOPIC (IRRIGATION / IRRIGATOR) ×3 IMPLANT
SET TUBE SMOKE EVAC HIGH FLOW (TUBING) ×3 IMPLANT
SHEARS HARMONIC ACE PLUS 45CM (MISCELLANEOUS) ×3 IMPLANT
SLEEVE XCEL OPT CAN 5 100 (ENDOMECHANICALS) ×9 IMPLANT
SPONGE GAUZE 2X2 STER 10/PKG (GAUZE/BANDAGES/DRESSINGS) ×1
STRIP CLOSURE SKIN 1/2X4 (GAUZE/BANDAGES/DRESSINGS) ×1 IMPLANT
SUT ETHIBOND 2 0 SH (SUTURE) ×9
SUT ETHIBOND 2 0 SH 36X2 (SUTURE) ×6 IMPLANT
SUT MNCRL AB 4-0 PS2 18 (SUTURE) ×3 IMPLANT
SUT SURGIDAC NAB ES-9 0 48 120 (SUTURE) ×12 IMPLANT
TIP INNERVISION DETACH 40FR (MISCELLANEOUS) IMPLANT
TIP INNERVISION DETACH 50FR (MISCELLANEOUS) IMPLANT
TIP INNERVISION DETACH 56FR (MISCELLANEOUS) IMPLANT
TIPS INNERVISION DETACH 40FR (MISCELLANEOUS)
TOWEL OR 17X26 10 PK STRL BLUE (TOWEL DISPOSABLE) ×3 IMPLANT
TOWEL OR NON WOVEN STRL DISP B (DISPOSABLE) IMPLANT
TRAY FOLEY MTR SLVR 16FR STAT (SET/KITS/TRAYS/PACK) ×3 IMPLANT
TRAY LAPAROSCOPIC (CUSTOM PROCEDURE TRAY) ×3 IMPLANT
TROCAR BLADELESS OPT 5 100 (ENDOMECHANICALS) ×3 IMPLANT
TROCAR XCEL BLUNT TIP 100MML (ENDOMECHANICALS) IMPLANT
TROCAR XCEL NON-BLD 11X100MML (ENDOMECHANICALS) ×3 IMPLANT
serosaFuse Implantable Fastener Kit ×26 IMPLANT

## 2021-06-08 NOTE — Transfer of Care (Signed)
Immediate Anesthesia Transfer of Care Note  Patient: Sara Bryant  Procedure(s) Performed: LAPAROSCOPIC REPAIR OF HIATAL HERNIA (Abdomen) ESOPHAGOGASTRODUODENOSCOPY (EGD) (Esophagus) TRANSORAL INCISIONLESS FUNDOPLICATION (Abdomen)  Patient Location: PACU  Anesthesia Type:General  Level of Consciousness: awake, alert , oriented and patient cooperative  Airway & Oxygen Therapy: Patient Spontanous Breathing and Patient connected to face mask oxygen  Post-op Assessment: Report given to RN, Post -op Vital signs reviewed and stable and Patient moving all extremities  Post vital signs: Reviewed and stable  Last Vitals:  Vitals Value Taken Time  BP 126/91 06/08/21 1001  Temp    Pulse 94 06/08/21 1003  Resp 23 06/08/21 1003  SpO2 97 % 06/08/21 1003  Vitals shown include unvalidated device data.  Last Pain:  Vitals:   06/08/21 0600  TempSrc:   PainSc: 0-No pain         Complications: No notable events documented.

## 2021-06-08 NOTE — Anesthesia Procedure Notes (Signed)
Procedure Name: Intubation Date/Time: 06/08/2021 7:46 AM Performed by: Victoriano Lain, CRNA Pre-anesthesia Checklist: Patient identified, Emergency Drugs available, Suction available, Patient being monitored and Timeout performed Patient Re-evaluated:Patient Re-evaluated prior to induction Oxygen Delivery Method: Circle system utilized Preoxygenation: Pre-oxygenation with 100% oxygen Induction Type: IV induction, Rapid sequence and Cricoid Pressure applied Laryngoscope Size: Mac and 4 Grade View: Grade I Tube type: Oral Tube size: 7.5 mm Number of attempts: 1 Airway Equipment and Method: Stylet Placement Confirmation: ETT inserted through vocal cords under direct vision, positive ETCO2 and breath sounds checked- equal and bilateral Secured at: 21 cm Tube secured with: Tape Dental Injury: Teeth and Oropharynx as per pre-operative assessment  Comments: RSI due to pt's severe GERD and HH. Cricoid pressure by Dr Smith Robert. Smooth RSI with Grade 1 view. ATOI.

## 2021-06-08 NOTE — H&P (Signed)
CC: Patient here for concomitant laparoscopic hiatal hernia repair and TIF procedure  Requesting provider: Dr. Bryan Lemma  HPI: Sara Bryant is an 35 y.o. female who is here for for laparoscopic hiatal hernia repair with concomitant TIF.  I initially saw her in the clinic in April.  She denies any medical changes since she was seen.  She states that she has been dealing with heartburn since she was 35 years of age.  She states that her dentist actually told her that she had some heartburn issues.  She states that she is refluxing or regurgitating constantly.  She would not have any projectile vomit per se.  She was put on twice daily PPI therapy and is currently on Dexilant.  She states that she was on Nexium for 14 to 15 years.  She states that her symptoms are excellent controlled with Dexilant.  She is taking 1 a day.  She denies any early satiety, dysphagia.  She has had a laparoscopic cholecystectomy.  She does not smoke.  She lifts weights.  She is cycle 70 to 80 miles per week.  She does not have any burping or belching.  Work-up as include manometry, EGD, pH probe many years ago.  She had a Bravo probe in October 2011.  She had a gastric imaging study in 2002.  Past Medical History:  Diagnosis Date   Allergy    Asthma    GERD (gastroesophageal reflux disease)    History of hiatal hernia    Obesity     Past Surgical History:  Procedure Laterality Date   BRONCHOSCOPY     CHOLECYSTECTOMY     ESOPHAGEAL MANOMETRY N/A 10/31/2020   Procedure: ESOPHAGEAL MANOMETRY (EM);  Surgeon: [name], DO;  Location: [facility];  Service: Gastroenterology;  Laterality: N/A;   ESOPHAGOGASTRODUODENOSCOPY     x4 last one done done 2018-2019 with Dr Oletta Lamas at [place] History  Adopted: Yes    Social:  reports that she has never smoked. She has never used smokeless tobacco. She reports that she does not drink alcohol and does not use drugs.  Allergies:   Allergies  Allergen Reactions   Levofloxacin Other (See Comments)    Pt states it effects her liver and causes jaundice.    Medications: I have reviewed the patient's current medications.   ROS - all of the below systems have been reviewed with the patient and positives are indicated with bold text General: chills, fever or night sweats Eyes: blurry vision or double vision ENT: epistaxis or sore throat Allergy/Immunology: itchy/watery eyes or nasal congestion Hematologic/Lymphatic: bleeding problems, blood clots or swollen lymph nodes Endocrine: temperature intolerance or unexpected weight changes Breast: new or changing breast lumps or nipple discharge Resp: cough, shortness of breath, or wheezing CV: chest pain or dyspnea on exertion GI: as per HPI GU: dysuria, trouble voiding, or hematuria MSK: joint pain or joint stiffness Neuro: TIA or stroke symptoms Derm: pruritus and skin lesion changes Psych: anxiety and depression  PE Blood pressure 138/89, pulse 96, temperature 99.1 F (37.3 C), temperature source Oral, resp. rate 16, height 5' 2.25" (1.581 m), weight 98.9 kg, last menstrual period 05/31/2021, SpO2 100 %. Constitutional: NAD; conversant; no deformities Eyes: Moist conjunctiva; no lid lag; anicteric; PERRL Neck: Trachea midline; no thyromegaly Lungs: Normal respiratory effort; no tactile fremitus CV: RRR; no palpable thrills; no pitting edema GI: Abd soft, old trocar sites, obese; no palpable hepatosplenomegaly MSK:  Normal gait; no clubbing/cyanosis Psychiatric: Appropriate affect; alert and oriented x3 Lymphatic: No palpable cervical or axillary lymphadenopathy Skin:no rash/lesions  Results for orders placed or performed during the hospital encounter of 06/08/21 (from the past 48 hour(s))  Pregnancy, urine     Status: None   Collection Time: 06/08/21  5:16 AM  Result Value Ref Range   Preg Test, Ur NEGATIVE NEGATIVE    Comment:        THE SENSITIVITY OF  THIS METHODOLOGY IS >20 mIU/mL. Performed at [facility], [place] [address], [place], [place] [address]     No results found.  Imaging: reviewed  A/P: Sara Bryant is an 35 y.o. female with GERD and hiatal hernia To operating room for concomitant laparoscopic hiatal hernia repair with TIF procedure by Dr. Bryan Lemma  All questions asked and answered. IV antibiotic Preoperative enhanced recovery protocol Subcutaneous heparin  Leighton Ruff. Redmond Pulling, MD, FACS General, Bariatric, & Minimally Invasive Surgery [facility], [place]

## 2021-06-08 NOTE — Op Note (Signed)
[facility] Patient Name: Sara Bryant Procedure Date: 06/08/2021 MRN: [id number] Attending MD: Gerrit Heck , MD Date of Birth: 1986-02-26 CSN: [id number] Age: 35 Admit Type: Inpatient Procedure:                Upper GI endoscopy with Savary dilation and TIF Indications:              For therapy of esophageal reflux, For therapy of                            hiatal hernia                           35 yo with a long-standing history of GERD and                            hiatal hernia presents for combined laparoscopic                            hiatal hernia repair and Transoral Incisionless                            Fundoplication (cTIF). Providers:                Gerrit Heck, MD, Jobe Igo, RN, Tyna Jaksch Technician Referring MD:              Medicines:                General Anesthesia Complications:            No immediate complications. Estimated Blood Loss:     Estimated blood loss was minimal. Procedure:                Pre-Anesthesia Assessment:                           - Prior to the procedure, a History and Physical                            was performed, and patient medications and                            allergies were reviewed. The patient's tolerance of                            previous anesthesia was also reviewed. The risks                            and benefits of the procedure and the sedation                            options and risks were discussed with the patient.                            All questions  were answered, and informed consent                            was obtained. Prior Anticoagulants: The patient has                            taken no previous anticoagulant or antiplatelet                            agents. ASA Grade Assessment: III - A patient with                            severe systemic disease. After reviewing the risks                            and benefits, the  patient was deemed in                            satisfactory condition to undergo the procedure.                           After obtaining informed consent, the endoscope was                            passed under direct vision. Throughout the                            procedure, the patient's blood pressure, pulse, and                            oxygen saturations were monitored continuously. The                            [id number] Olympus endoscope was introduced                            through the mouth, and advanced to the second part                            of duodenum. The upper GI endoscopy was                            accomplished without difficulty. The patient                            tolerated the procedure well. Scope In: Scope Out: Findings:      The examined esophagus was normal. In preparation of passing the larger       caliber Esophyx device, we decided to perform empiric esophageal       dilation. A guidewire was placed and the scope was withdrawn. Dilation       was performed with a Savary dilator with no resistance at 17 mm. The       dilation site was examined following endoscope reinsertion and showed no  bleeding, mucosal tear or perforation. Estimated blood loss: none.      The Z-line was regular and was found 35 cm from the incisors. The       decision was made to perform transoral fundoplication with the EsophyX       Z+ system. Before the procedure, the gastroesophageal flap valve was       classified as Hill Grade II (fold present, opens with respiration). The       endoscope was withdrawn, placed through the plication device, reinserted       into the patient and advanced past the level of the GE junction at 35 cm       from the incisors and into the stomach. Next, the endoscope was advanced       beyond the device and retroflexed. The first plication site was       identified at the 1 o'clock position. With the device in the proper        position, the helical retractor was deployed and tissue was pulled into       the mold before it was closed. The device was rotated, suction was       applied using the invaginator, then the device was advanced slightly and       two H-shaped fasteners were placed. The device was reloaded and the       process repeated in order to deploy a total of 12 at the first site. The       device was then rotated to the 11 o'clock position after which the       helical retractor was used to grasp additional tissue within the mold       before rotation and deployment of a total of six fasteners at the second       site. To complete reconstruction of the valve, additional fasteners were       deployed at the following sites: four fasteners at 5 o'clock and four       fasteners at 7 o'clock positions. In total, 26 fasteners contributed to       create a valve measuring 3 cm in length which involved 300 degrees of       the circumference upon retroflexed view. The EsophyX device and       endoscope were then removed. Relook endoscopy was performed prior to the       conclusion of the case to confirm the above findings. Estimated blood       loss was minimal.      A few small sessile polyps with no stigmata of recent bleeding were       found in the gastric fundus and in the gastric body. These were       previously biopsied and confirmed to be benign fundic gland polyps. The       remainder of the stomach was otherwise normal appearing.      The examined duodenum was normal. Impression:               - Normal esophagus. Dilated with 17 mm Savary                            dilator.                           - Z-line regular, 35 cm from the incisors.                           -  A few gastric polyps.                           - Normal examined duodenum.                           - EsophyX transoral fundoplication was performed.                           - No specimens collected. Moderate  Sedation:      Not Applicable - Patient had care per Anesthesia. Recommendation:           -Admit to surgical ward for overnight observation                            with anticipated discharge tomorrow                           -Zofran 4 mg IV every 6 hours x24 hours, then prn                           -Reglan 10 mg every 6 hours x24 hours, then prn                           -Resume scopolamine patch x3 days (applied preop)                           -Protonix 40 mg p.o. BID while inpatient, then                            resume Aciphex 20 mg BID x2 weeks, then 20 mg daily                            x2 weeks, then prn                           -Decadron 8 mg every 6 hours times max 5 doses                           -Gas-X (simethicone) 80 mg p.o. prn every 6 hours                            gas pain, abdominal discomfort                           -Tylenol 3 (APAP 120 mg/codeine 12 mg per 5 mL): 15                            mL's every 4 hours prn pain                           -Colace 100 mg p.o. twice daily if taking pain  medications                           -Clear liquid diet okay overnight                           -Okay to ambulate with assist around the ward                           -Please do not hesitate to contact me directly with                            any postoperative questions or concerns Procedure Code(s):        --- Professional ---                           [phone], Laparoscopy, surgical, repair of                            paraesophageal hernia, includes fundoplasty, when                            performed; without implantation of mesh                           43248, Esophagogastroduodenoscopy, flexible,                            transoral; with insertion of guide wire followed by                            passage of dilator(s) through esophagus over guide                            wire Diagnosis Code(s):        --- Professional  ---                           K31.7, Polyp of stomach and duodenum                           K21.9, Gastro-esophageal reflux disease without                            esophagitis                           K44.9, Diaphragmatic hernia without obstruction or                            gangrene CPT copyright 2019 [employer]. All rights reserved. The codes documented in this report are preliminary and upon coder review may  be revised to meet current compliance requirements. Gerrit Heck, MD 06/08/2021 10:23:19 AM Number of Addenda: 0

## 2021-06-08 NOTE — Anesthesia Postprocedure Evaluation (Signed)
Anesthesia Post Note  Patient: Sara Bryant  Procedure(s) Performed: LAPAROSCOPIC REPAIR OF HIATAL HERNIA (Abdomen) ESOPHAGOGASTRODUODENOSCOPY (EGD) (Esophagus) TRANSORAL INCISIONLESS FUNDOPLICATION (Abdomen)     Patient location during evaluation: PACU Anesthesia Type: General Level of consciousness: awake and alert Pain management: pain level controlled Vital Signs Assessment: post-procedure vital signs reviewed and stable Respiratory status: spontaneous breathing, nonlabored ventilation, respiratory function stable and patient connected to nasal cannula oxygen Cardiovascular status: blood pressure returned to baseline and stable Postop Assessment: no apparent nausea or vomiting Anesthetic complications: no   No notable events documented.  Last Vitals:  Vitals:   06/08/21 1351 06/08/21 1452  BP: (!) 153/97 137/86  Pulse: 60 88  Resp: 16 16  Temp: 36.6 C 36.7 C  SpO2: 96% 98%    Last Pain:  Vitals:   06/08/21 1452  TempSrc: Oral  PainSc:                  [name]

## 2021-06-08 NOTE — Progress Notes (Signed)
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  Was diagnosed with GERD ~age 1-15, with multiple acid suppression agents over the years.  Has trialed high-dose pantoprazole (suboptimal response), Dexilant (good response, but cost prohibitive), and currently treated with Aciphex 20 mg bid.  Has intermittent breakthrough symptoms despite high-dose PPI.  Avoids eating close to bedtime.  Sleeps with HOB elevated.  Has been diagnosed with enamel erosion.  She is an avid cyclist, cycling 75-80 miles per week.  Regular exercise, going to the gym 6 days/week.  No significant changes in medical history since last appointment.    GERD history: -Index symptoms: Regurgitation -Exacerbating features: Post prandial independent of food types -Medications trialed: Zantac, Nexium, Prilosec, pantoprazole, Pepcid, Dexilant -Current medications: Aciphex 20 mg bid; MVI with calcium/vitamin D -Complications: Hiatal hernia, enamel erosion   GERD evaluation: -Last EGD:  06/2020 -Barium esophagram: None -Esophageal Manometry: 10/2020: Normal -pH/Impedance: reports this was done ~1999/2000. No report for review.  -Bravo:  06/2020:  Increase esophageal acid exposure with % time pH<4 5.0%.  DeMeester score 18.8 (normal <14.72). Esophageal exposure is abnormal in supine position. No symptoms reported for symptom correlation with reflux events based on SAP. C/w significant gastroesophageal reflux -GES: 03/2001: Normal   Endoscopic History: -EGD (06/2020, Dr. Bryan Lemma): Hill grade 3 valve, 1-2 cm axial height x 3 cm transverse width hiatal hernia,  fundic gland polyps. Bravo placed -EGD (04/2017, Dr. Oletta Lamas): Fundic gland polyps, medium sized hiatal hernia, no Barrett's -EGD (2010, Dr. Oletta Lamas): Normal esophagus, normal small bowel with biopsies negative for Celiac -EGD (2000): Esophagitis per patient. No report for review  Past Medical History:  Diagnosis Date  . Allergy   . Asthma   . GERD (gastroesophageal reflux disease)   . History of hiatal hernia   . Obesity      Past Surgical History:  Procedure Laterality Date  . BRONCHOSCOPY    . CHOLECYSTECTOMY    . ESOPHAGEAL MANOMETRY N/A 10/31/2020   Procedure: ESOPHAGEAL MANOMETRY (EM);  Surgeon: [name], DO;  Location: [facility];  Service: Gastroenterology;  Laterality: N/A;  . ESOPHAGOGASTRODUODENOSCOPY     x4 last one done done 2018-2019 with Dr Oletta Lamas at [place]  . EXTERNAL EAR SURGERY     Family History  Adopted: Yes   Social History   Tobacco Use  . Smoking status: Never  . Smokeless tobacco: Never  Vaping Use  . Vaping Use: Never used  Substance Use Topics  . Alcohol use: Never  . Drug use: Never   Current Facility-Administered Medications  Medication Dose Route Frequency Provider Last Rate Last Admin  . 0.9 %  sodium chloride infusion   Intravenous Continuous Madgeline Rayo V, DO      . bupivacaine liposome (EXPAREL) 1.3 % injection 266 mg  20 mL Infiltration Once Greer Pickerel, MD      . ceFAZolin (ANCEF) IVPB 2g/100 mL premix  2 g Intravenous On Call to OR Greer Pickerel, MD      . Chlorhexidine Gluconate Cloth 2 % PADS 6 each  6 each Topical Once Greer Pickerel, MD       And  . Chlorhexidine Gluconate Cloth 2 % PADS  6 each  6 each Topical Once Greer Pickerel, MD      . dexamethasone (DECADRON) injection 4 mg  4 mg Intravenous On Call to OR Greer Pickerel, MD      . famotidine (PEPCID) IVPB 20 mg premix  20 mg Intravenous Once Nija Koopman, [place], DO      . [START ON 06/09/2021] feeding supplement (ENSURE PRE-SURGERY) liquid 296 mL  296 mL Oral Once  Greer Pickerel, MD      . lactated ringers infusion   Intravenous Continuous Lynda Rainwater, MD 10 mL/hr at 06/08/21 [phone] Continued from Pre-op at 06/08/21 0648  . scopolamine (TRANSDERM-SCOP) 1 MG/3DAYS 1.5 mg  1 patch Transdermal On Call to OR Greer Pickerel, MD   1.5 mg at 06/08/21 0601   Allergies  Allergen Reactions  . Levofloxacin Other (See Comments)    Pt states it effects her liver and causes jaundice.     Review of Systems: All systems reviewed and negative except where noted in HPI.     Physical Exam:    Wt Readings from Last 3 Encounters:  06/08/21 98.9 kg  06/02/21 98.9 kg  09/07/20 99.3 kg    BP 138/89   Pulse 96   Temp 99.1 F (37.3 C) (Oral)   Resp 16   Ht 5' 2.25" (1.581 m)   Wt 98.9 kg   LMP 05/31/2021 Comment: urine preg.pending,06/08/21  SpO2 100%   BMI 39.56 kg/m  Constitutional:  Pleasant, in no acute distress. Psychiatric: Normal mood and affect. Behavior is normal. Cardiovascular: Normal rate, regular rhythm. No edema Pulmonary/chest: Effort normal and breath sounds normal. No wheezing, rales or rhonchi. Abdominal: Soft, nondistended, nontender.  Neurological: Alert and oriented to person place and time. Skin: Skin is warm and dry. No rashes noted.   ASSESSMENT AND PLAN;   1) GERD 2) Hiatal hernia - Plan for laparoscopic hiatal hernia repair and TIF today - Plan for overnight admission for 23-hour observation - IV ABX intraoperatively - Plan to continue scheduled antiemetics postoperatively x24 hours - Plan to continue acid suppression therapy postoperatively with plan to wean after 2 weeks - Patient has already been educated on the postoperative dietary and exercise/activity restrictions - Additional recommendations pending completion of surgery today   [name], DO, FACG  06/08/2021, 7:36 AM   No ref. provider found

## 2021-06-08 NOTE — Op Note (Signed)
06/08/2021  9:07 AM  PATIENT:  Sara Bryant  35 y.o. female  PRE-OPERATIVE DIAGNOSIS:  hiatal hernia/GERD  POST-OPERATIVE DIAGNOSIS:  hiatal hernia/GERD  PROCEDURE:  Procedure(s): LAPAROSCOPIC REPAIR OF HIATAL HERNIA ESOPHAGOGASTRODUODENOSCOPY (EGD)-Dr Cirigliano TRANSORAL INCISIONLESS FUNDOPLICATION-Dr Cirigliano LAPAROSCOPIC BILATERAL TAP BLOCK  SURGEON:  Surgeon(s): Greer Pickerel, MD  [name], DO  ASSISTANTS: Ralene Ok, MD   ANESTHESIA:   general  DRAINS: none   LOCAL MEDICATIONS USED:  MARCAINE    and OTHER exparel  SPECIMEN:  No Specimen  DISPOSITION OF SPECIMEN:  N/A  COUNTS:  YES  INDICATION FOR PROCEDURE: Patient was referred for hiatal hernia repair in anticipation of a planned TIF procedure by her gastroenterologist.  Patient had longstanding GERD and heartburn.  Her symptoms are well controlled on her current PPI but she desired to come off medication.  Her gastroenterologist referred her to discuss hiatal hernia repair since there is concerned that she may have a small sliding hiatal hernia based on upper endoscopy.  Please see outside records for additional detail including my office note regarding my discussion with patient regarding risk and benefits and potential risk of hiatal hernia recurrence  PROCEDURE: Patient was given preoperative medication for enhanced recovery protocols.  She was also given subcutaneous heparin for DVT prophylaxis.  After informed consent was obtained she was taken to the OR for at [facility] placed supine on the operating room table.  General endotracheal anesthesia was established.  Sequential compression devices were placed.  The patient had voided prior to going back to the operating room.  Her arms were tucked at her side with the appropriate padding.  Her abdomen is prepped and draped in the usual standard surgical fashion.  IV antibiotic was administered.  Surgical timeout was performed.  Access to the  abdomen was obtained in the left upper quadrant at Palmer's point using the Optiview technique.  A small incision was made.  Using a 0 degree 5 mm laparoscope through a 5 mm trocar I advanced it through the abdominal wall.  I initially insufflated preperitoneal but on reinsertion I was within the abdominal cavity.  Pneumoperitoneum was established.  The abdominal cavity was surveilled.  There is no evidence of injury to surrounding viscera.  Patient was placed in extreme reverse Trendelenburg.  Additional trochars were placed.  A 5 mm trocar was placed above and slightly to the left of the umbilicus.  A 5 mm trocar in the right lateral abdominal wall and an 11 mm trocar in the right mid abdominal wall.  A 5 mm trocar in the left lateral abdominal wall.  A Nathanson liver retractor was placed in the subxiphoid position to lift up the left lobe of the liver to expose the hiatus.  Patient appeared to have weakness at the diaphragm.  The gastrohepatic ligament was incised with harmonic scalpel.  My assistant retracted the lesser curvature the stomach and the perigastric fat tissue to the patient's left.  Identified the right crus of the diaphragm.  I carefully incised the peritoneum directly on top of it.  Then using gentle blunt dissection with a Prestige grasper I was able to identify the confluence of the left and right crura.  There was a small sliding hiatal hernia.  Probably of about 2-1/2 cm.  I continued mobilizing up the right crus of the diaphragm.  I continued anteriorly over to the left crus of the diaphragm with harmonic scalpel ensuring that the energy blade was not near the esophagus.  The posterior vagus nerve was identified.  I did not have to do a lot of circumferential esophageal mobilization as there is already at least 2 and half centimeters of esophagus within the abdominal cavity.  The left and right crura were in good shape.  They were not attenuated.  I ended up placing 2 interrupted 0 Surgidac  Ethibond sutures with the Endo Stitch each secured with a titanium tie knot.  I then performed a laparoscopic bilateral tap block for postoperative pain relief.  The 11 Miller trocar site was removed and the fascial defect was closed with an interrupted 0 Vicryl using the PMI suture passer.  Pneumoperitoneum was released.  Trochars were removed.  Skin incisions were closed with a 4 Monocryl in a subcuticular fashion followed by the application of Steri-Strips, 2 x 2's and Tegaderms.  All needle, instrument and sponge counts were correct x2.  I stayed in the operating room until Dr. Bryan Lemma started his portion of the procedure.  Please see his procedure note for additional details regarding the TIF portion.  I stayed in the operating room and visualized him coming down the esophagus with the endoscope.  There is no evidence of esophageal or GE junction injury.  He insufflated the stomach and retroflexed there was now a Hill grade 1 valve view.  At this time a left the operating room boat that was immediately available should I be needed.  PLAN OF CARE: Admit for overnight observation  PATIENT DISPOSITION:  PACU - hemodynamically stable.   Delay start of Pharmacological VTE agent (>24hrs) due to surgical blood loss or risk of bleeding:  no  Leighton Ruff. Redmond Pulling, MD, FACS General, Bariatric, & Minimally Invasive Surgery [facility], [place]

## 2021-06-09 ENCOUNTER — Encounter (HOSPITAL_COMMUNITY): Payer: Self-pay | Admitting: General Surgery

## 2021-06-09 DIAGNOSIS — J45909 Unspecified asthma, uncomplicated: Secondary | ICD-10-CM | POA: Diagnosis not present

## 2021-06-09 DIAGNOSIS — K449 Diaphragmatic hernia without obstruction or gangrene: Secondary | ICD-10-CM | POA: Diagnosis not present

## 2021-06-09 DIAGNOSIS — K219 Gastro-esophageal reflux disease without esophagitis: Secondary | ICD-10-CM | POA: Diagnosis not present

## 2021-06-09 MED ORDER — METOCLOPRAMIDE HCL 10 MG PO TABS: 10.0000 mg | ORAL_TABLET | Freq: Three times a day (TID) | ORAL | 1 refills | Status: DC | PRN

## 2021-06-09 MED ORDER — ONDANSETRON HCL 4 MG PO TABS: 4.0000 mg | ORAL_TABLET | Freq: Three times a day (TID) | ORAL | 1 refills | Status: DC | PRN

## 2021-06-09 MED ORDER — SIMETHICONE 80 MG PO CHEW
80.0000 mg | CHEWABLE_TABLET | Freq: Four times a day (QID) | ORAL | 0 refills | Status: DC | PRN
Start: 2021-06-09 — End: 2021-09-19

## 2021-06-09 MED ORDER — ENOXAPARIN SODIUM 60 MG/0.6ML IJ SOSY
50.0000 mg | PREFILLED_SYRINGE | Freq: Every day | INTRAMUSCULAR | Status: DC
  Administered 2021-06-09: 50 mg via SUBCUTANEOUS
  Filled 2021-06-09: qty 0.6

## 2021-06-09 NOTE — Progress Notes (Signed)
1 Day Post-Op   Subjective/Chief Complaint: Doing well No nausea Pain ok Tolerating liquids A little blood at one incision   Objective: Vital signs in last 24 hours: Temp:  [97.6 F (36.4 C)-98.8 F (37.1 C)] 98.6 F (37 C) (09/16 0640) Pulse Rate:  [60-88] 84 (09/16 0640) Resp:  [16-27] 18 (09/16 0640) BP: (123-157)/(61-101) 128/61 (09/16 0640) SpO2:  [93 %-99 %] 98 % (09/16 0640) Last BM Date: 06/07/21  Intake/Output from previous day: 09/15 0701 - 09/16 0700 In: 3374.1 [P.O.:780; I.V.:2444.1; IV Piggyback:150] Out: 1625 [Urine:1600; Blood:25] Intake/Output this shift: No intake/output data recorded.  Alert, nontoxic Smiling Soft, obese, incisions ok, no redness/rash  Lab Results:  No results for input(s): WBC, HGB, HCT, PLT in the last 72 hours. BMET No results for input(s): NA, K, CL, CO2, GLUCOSE, BUN, CREATININE, CALCIUM in the last 72 hours. PT/INR No results for input(s): LABPROT, INR in the last 72 hours. ABG No results for input(s): PHART, HCO3 in the last 72 hours.  Invalid input(s): PCO2, PO2  Studies/Results: No results found.  Anti-infectives: Anti-infectives (From admission, onward)    Start     Dose/Rate Route Frequency Ordered Stop   06/08/21 0600  ceFAZolin (ANCEF) IVPB 2g/100 mL premix        2 g 200 mL/hr over 30 Minutes Intravenous On call to O.R. 06/08/21 0515 06/08/21 0817   06/08/21 0530  ceFAZolin (ANCEF) IVPB 2g/100 mL premix  Status:  Discontinued        2 g 200 mL/hr over 30 Minutes Intravenous  Once 06/08/21 0515 06/08/21 0518       Assessment/Plan: s/p Procedure(s): LAPAROSCOPIC REPAIR OF HIATAL HERNIA (N/A) ESOPHAGOGASTRODUODENOSCOPY (EGD) (N/A) TRANSORAL INCISIONLESS FUNDOPLICATION (N/A)  Doing well after Lap HHR with cTIF No fever. No tachycardia Tolerating liquids Dispo per GI Discussed discharge instructions with pt F/u with me in 3-4 weeks  LOS: 0 days    Sara Bryant 06/09/2021

## 2021-06-09 NOTE — Discharge Summary (Signed)
[facility] DISCHARGE SUMMARY  Date of admission: 06/08/2021 Date of discharge: 06/09/2021 Attending: Dr. Bryan Lemma Primary Care provider: None Discharge diagnosis: GERD, hiatal hernia Consultations: None Procedures performed: EGD with Transoral Incisionless Fundoplication (TIF) History: See admission H&P Exam: See below  Hospital course: 1) GERD 2) Hiatal hernia Sara Bryant is a 35 y.o. female s/p laparoscopic hiatal hernia repair and EGD with Transoral Incisionless Fundoplication (TIF) completed on [id number] without complications. Did well overnight, without any acute events.  Tolerating liquid diet and oral medications without issue.  Discharge vital signs: See below Discharge labs: None Disposition: Home in stable condition Follow-up appointments: See below  Day of Discharge:  Did well overnight, without any acute events.  Tolerating liquid diet and oral medications without issue.  Objective: Vital signs in last 24 hours: Temp:  [97.6 F (36.4 C)-98.8 F (37.1 C)] 98.6 F (37 C) (09/16 0640) Pulse Rate:  [60-88] 84 (09/16 0640) Resp:  [16-27] 18 (09/16 0640) BP: (123-157)/(61-101) 128/61 (09/16 0640) SpO2:  [93 %-99 %] 98 % (09/16 0640) Last BM Date: 06/07/21 General: NAD Lungs:  CTA b/l, no w/r/r Heart:  RRR, no m/r/g Abdomen:  Mild expected post operative TTP in epigastrium without rebound or guarding, no peritoneal signs. Otherwise, soft, ND, +BS Ext:  No c/c/e    Intake/Output from previous day: 09/15 0701 - 09/16 0700 In: 3374.1 [P.O.:780; I.V.:2444.1; IV Piggyback:150] Out: 1625 [Urine:1600; Blood:25] Intake/Output this shift: No intake/output data recorded.   Lab Results: No results for input(s): WBC, HGB, PLT, MCV in the last 72 hours. BMET No results for input(s): NA, K, CL, CO2, GLUCOSE, BUN, CREATININE, CALCIUM in the last 72 hours. LFT No results for input(s): PROT, ALBUMIN, AST, ALT, ALKPHOS, BILITOT, BILIDIR, IBILI in the last  72 hours. PT/INR No results for input(s): INR in the last 72 hours.    Imaging/Other results: No results found.    Assessment and Plan:  Sara Bryant is a 35 y.o. female s/p EGD with Transoral Incisionless Fundoplication (TIF) completed yesterday with no events on overnight observation. Will plan on d/c to home today with the following plan:   - Aciphex 20 mg PO BID for 2 weeks, then 20 mg daily for 2 weeks, then prn  - Discharge with Zofran 4 mg PO prn Q6 hours for nausea  - Discharge with Reglan 10 mg PO prn Q6 hours for nausea  - Discharge with Simethicone 80 mg PO prn Q6 hours for bloating/abdominal discomfort - OTC Tylenol as needed for mild postoperative pain  Diet:  - 2 weeks of liquid soft foods followed by 4 weeks slowly progressive diet back to regular  - Provided with handout for post operative diet plan   Post Op Activity:  - Week 1: encourage short distance walking, minimal physical activity, no lifting >5 lbs  - Week 2: Slow climbing stairs, no intense exercise, no lifting >5 lbs  - Week 3-6: No intense exercise, may lift up to 25 lbs  - Week 7: Resume normal activity   - To follow-up with me on 06/20/2021 at 8:40 AM in the [facility] at [facility]  Follow-up with Dr. Redmond Pulling in the [facility] as scheduled    [name], DO  06/09/2021, 7:55 AM [facility] Pager [phone]

## 2021-06-09 NOTE — Discharge Instructions (Addendum)
Remove clear bandage tomorrow May shower on Saturday If start having redness around steri-strips - can remove them Take miralax as needed for constipation Follow diet instructions provided by Dr Bryan Lemma Call [facility] [phone] for concerns about incisions (redness, foul smelling drainage), severe abdominal pain, temperature greater [facility]  Post-Op TIF Diet and General Guidelines:  Following a special diet after surgery is necessary for healing. You may encounter "good and bad days." Often foods that go down easily one day may give you problems the next. Realize this is part of the healing process. Your diet will progress slowly from liquids to soft foods. Eating six to eight small, frequent meals per day is recommended to ensure adequate nutrition intake and prevent overeating at one meal (distention). Crush all home medications until your diet has returned to 100% regular consistency.  Tips for Tolerating Your Diet: Warm fluids prior to eating may help to lubricate/relax your throat. Introduce foods one at a time to determine tolerance. Assess if milky foods coat your throat too much. Toasted and overcooked grains may be better tolerated as they will disintegrate instead of swell. Make meats tender by slow cooking, chewing, adding tenderizer spice or over the counter papain (papaya), bromelain (pineapple) or ficain (figs) meat protein enzymes. Sit upright while eating and drinking. Remain upright 20 minutes after eating and drinking. Sip fluids slowly, avoid straws and carbonation and do not chew gum to prevent air/gas intake. When diet has progressed to solids chew well (20-30 chews per bite) and eat slowly (30 minutes per meal). Stay hydrated; Dark urine, chapped lips and white tongue are symptoms of dehydration.  Dietary Advancement:                                     Stage 1 Clear Liquid Diet This diet begins while you are in the  hospital. Room temperature and warm liquids may be best tolerated Food Allowed: Water Fruit Juices Broth Jello Decaf tea/coffee (without milk or creamer) [place] ice/popsicles  Stage 2 Full Liquids After you are discharged home and tolerating clear liquids, you may advance to thick liquids. You will remain on this diet for 2 weeks. Food Allowed: Anything from stage 1 diet Milk Thinned hot cereals (cream of wheat) Strained/blended cream soups Ice cream/sherbet/custard/pudding Yogurt (without fruit seeds) Milkshakes/frappe Fruit and vegetable juices Protein drink/supplement: Boost, Ensure, [employer], Special K, Slimfast (These can help add more protein/calories to prevent fatigue, weight loss and muscle loss with restricted diet.  Stage 3 Soft/Blended Diet After 2 weeks of Stage 2 diet, start adding soft/blended foods into your diet. Chew and blend foods well. Add one new thing at a time to assess tolerance. You will remain on this diet for 2 weeks. Foods to add into diet: Anything from Stage 1 and Stage 2 diet Moist fish Blended chicken or ham Tofu Eggs Meats mixed with mayonnaise, mustard or salad dressings Dairy including cottage cheese and soft cheese Legumes including beans, lentils, hummus, bean soup, peas-mashed easily with a fork Pureed squash, potatoes, carrots, cauliflower, green beans Applesauce and mashed bananas Crispy foods including seedless crackers, toasted breads/bread products, overcooked pasta/rice  Stage 4 Soft Diet After 2 weeks of Stage 3 diet advance to the final stage by adding foods that are soft in consistency and easily digested. You will remain on this diet for 2 weeks. Foods to add into diet: Anything  from Stage 1, Stage 2, and Stage 3 diet Thinly shaved deli meats Well chewed meat and moist poultry Shrimp, crab, imitation products Soft Cooked vegetables Seedless, skinless /peeled fruits   Avoid dry, baked chicken, tough  steak, nuts, seeds, popcorn, raw fruits and raw vegetables until 8 weeks post op from day of surgery.   POST PROCEDURE RESTRICTIONS:  WEEK 1 and 2=NO LIFTING MORE THAN 5 POUNDS.  WEEKS 3 THROUGH 4=NO LIFTING MORE THAN 25 POUNDS.

## 2021-06-09 NOTE — Progress Notes (Signed)
Discharge instructions discussed with patient, verbalized agreement and understanding 

## 2021-06-14 ENCOUNTER — Telehealth: Payer: Self-pay | Admitting: Gastroenterology

## 2021-06-14 NOTE — Telephone Encounter (Signed)
Inbound call from patient. States she took tylenol liquid medicine around 6:30am and her throat started burning. 3 hours later it is still burning. Feels like rubbed raw. Best contact number [phone]

## 2021-06-14 NOTE — Telephone Encounter (Signed)
Can triual viscous lidocaine 4%. Drink 5 mL every 6 hours as needed for odynophagia, pain. RF1. Thanks

## 2021-06-14 NOTE — Telephone Encounter (Signed)
Spoke with patient, she states that she took Tylenol liquid medication this morning (she is alternating between Ibuprofen) and it hurt a little going down so she drank some juice (white peach juice) to wash it down. She also tried some milk and pudding because it had a thicker consistency but that did not help. Patient reports this sent her into a coughing spell this morning and has made her belly hurt. She states that she had been doing fine since she left the hospital. She states that its not her esophagus but right in the back of her throat. Pt reports that she did take Aciphex this morning, she has continued Reglan and Simethicone. Please advise, thanks.

## 2021-06-15 MED ORDER — LIDOCAINE VISCOUS HCL 2 % MT SOLN: OROMUCOSAL | 1 refills | Status: DC

## 2021-06-15 NOTE — Telephone Encounter (Signed)
Yes, ok to proceed with 2%. Thanks.

## 2021-06-15 NOTE — Telephone Encounter (Signed)
Spoke with patient in regards to recommendations. Pt is aware that RX has been sent to pharmacy on file. Pt verbalized understanding and had no concerns at the end of the call.

## 2021-06-20 ENCOUNTER — Encounter: Payer: Self-pay | Admitting: Gastroenterology

## 2021-06-20 ENCOUNTER — Ambulatory Visit (INDEPENDENT_AMBULATORY_CARE_PROVIDER_SITE_OTHER): Payer: BC Managed Care – PPO | Admitting: Gastroenterology

## 2021-06-20 ENCOUNTER — Other Ambulatory Visit: Payer: Self-pay

## 2021-06-20 VITALS — BP 128/82 | HR 80 | Ht 62.25 in | Wt 215.4 lb

## 2021-06-20 DIAGNOSIS — Z8719 Personal history of other diseases of the digestive system: Secondary | ICD-10-CM

## 2021-06-20 DIAGNOSIS — Z9889 Other specified postprocedural states: Secondary | ICD-10-CM | POA: Diagnosis not present

## 2021-06-20 DIAGNOSIS — K219 Gastro-esophageal reflux disease without esophagitis: Secondary | ICD-10-CM

## 2021-06-20 NOTE — Progress Notes (Signed)
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  Was diagnosed with GERD ~age [age], with multiple acid suppression agents over the years.  Trialed high-dose pantoprazole (suboptimal response), Dexilant (good response, but cost prohibitive), and Aciphex 20 mg bid.  Had intermittent breakthrough symptoms despite high-dose PPI and opted for laparoscopic hiatal repair and TIF on 06/08/2021 as a means to better control reflux and stop or significantly reduce need for acid suppression therapy.  She is an avid cyclist, cycling 75-80 miles per week.  Regular exercise, going to the gym 6 days/week.    GERD history: -Index symptoms: Regurgitation -Exacerbating features: Post prandial independent of food types -Medications trialed: Zantac, Nexium, Prilosec, pantoprazole, Pepcid, Dexilant -Current medications: Aciphex 20 mg bid (will start to wean this week); MVI with calcium/vitamin D -Complications: Hiatal hernia, enamel erosion   GERD evaluation: -Last EGD:  06/2020 -Barium esophagram: None -Esophageal Manometry: 10/2020: Normal -pH/Impedance: reports this was done ~1999/2000. No report for review.  -Bravo:  06/2020:  Increase esophageal acid exposure with % time pH<4 5.0%.  DeMeester score 18.8 (normal <14.72). Esophageal exposure is abnormal in supine position. No symptoms reported for symptom correlation with reflux events based on SAP. C/w significant gastroesophageal reflux -GES: 03/2001: Normal - Laparoscopic hiatal hernia repair and Transoral Incisionless Fundoplication (cTIF) on 7/98/9211   Endoscopic History: - EGD with TIF (06/08/2021, Dr. Bryan Lemma) with 26 Serofuse fasteners placed -EGD (06/2020, Dr. Bryan Lemma): Hill grade 3 valve, 1-2 cm axial height x 3 cm transverse width hiatal hernia, fundic gland polyps. Bravo placed -EGD (04/2017, Dr. Oletta Lamas): Fundic gland polyps,  medium sized hiatal hernia, no Barrett's -EGD (2010, Dr. Oletta Lamas): Normal esophagus, normal small bowel with biopsies negative for Celiac -EGD (2000): Esophagitis per patient. No report for review  HPI:     Patient is a 35 y.o. female presenting to the Gastroenterology Clinic for postoperative follow-up following concomitant laparoscopic hiatal hernia repair and TIF on 06/08/2021.  Did well postoperatively and was discharged on 06/09/2021.   Since hospital discharge, has been slowly advancing diet and activity per postoperative protocol.  Did require some viscous lidocaine postoperatively for odynophagia.  This has since resolved.  Still taking Aciphex 20 mg BID with plan to wean to 20 mg daily at the end of the week x2 weeks, then reduce to on demand.  She is otherwise feeling well and without any active issues today.  Has f/u with Dr. Redmond Pulling 07/05/2021.   Review of systems:     No chest pain, no SOB, no fevers, no urinary sx   Past Medical History:  Diagnosis Date   Allergy    Asthma    GERD (gastroesophageal reflux disease)    History of hiatal hernia    Obesity     Patient's surgical history, family medical history, social history, medications and allergies were all reviewed in Epic    Current Outpatient Medications  Medication Sig Dispense Refill   albuterol (VENTOLIN HFA) 108 (90 Base) MCG/ACT inhaler Inhale 2 puffs into the lungs every 6 (six) hours as needed for shortness of breath or wheezing.     calcium-vitamin D 250-100 MG-UNIT tablet Take 1 tablet by mouth daily.     cetirizine (ZYRTEC) 10 MG tablet Take 10 mg by mouth daily.     JUNEL FE 1.5/30 1.5-30 MG-MCG tablet Take 1 tablet by mouth daily.     lidocaine (XYLOCAINE) 2 % solution Drink 5  ml by mouth every 6 hours as needed for mouth/throat pain 200 mL 1   Magnesium Oxide 250 MG TABS Take 250 mg by mouth daily.     metoCLOPramide (REGLAN) 10 MG tablet Take 1 tablet (10 mg total) by mouth every 8 (eight) hours as  needed for nausea. 30 tablet 1   mometasone-formoterol (DULERA) 200-5 MCG/ACT AERO Inhale 2 puffs into the lungs 2 (two) times daily as needed for wheezing or shortness of breath.     montelukast (SINGULAIR) 10 MG tablet Take 10 mg by mouth at bedtime.     Multiple Vitamin (MULTIVITAMIN) tablet Take 1 tablet by mouth daily.     ondansetron (ZOFRAN) 4 MG tablet Take 1 tablet (4 mg total) by mouth every 8 (eight) hours as needed for nausea or vomiting. 30 tablet 1   RABEprazole (ACIPHEX) 20 MG tablet TAKE 1 TABLET BY MOUTH TWICE A DAY 180 tablet 0   simethicone (MYLICON) 80 MG chewable tablet Chew 1 tablet (80 mg total) by mouth every 6 (six) hours as needed for flatulence (gas, bloating, abdominal discomfort). 30 tablet 0   No current facility-administered medications for this visit.    Physical Exam:     BP 128/82   Pulse 80   Ht 5' 2.25" (1.581 m)   Wt 215 lb 6 oz (97.7 kg)   LMP 05/31/2021   SpO2 98%   BMI 39.08 kg/m   GENERAL:  Pleasant female in NAD PSYCH: : Cooperative, normal affect EENT:  conjunctiva pink, mucous membranes moist, neck supple without masses CARDIAC:  RRR, no murmur heard, no peripheral edema PULM: Normal respiratory effort, lungs CTA bilaterally, no wheezing ABDOMEN: Mild, appropriate TTP over abdominal incision site on right.  No rebound or guarding.  No peritoneal signs.  Nondistended, soft,  SKIN:  turgor, no lesions seen Musculoskeletal:  Normal muscle tone, normal strength NEURO: Alert and oriented x 3, no focal neurologic deficits   IMPRESSION and PLAN:    1) History of GERD s/p cTIF 2) History of hiatal hernia now s/p hiatal hernia repair - Okay to start weaning Aciphex to 20 mg/day x2 weeks, then 20 mg qod x1 week, then on demand only - Continue slowly advancing diet as tolerated per postoperative protocol - Continue slowly advancing activity/exercise as tolerated per postoperative protocol - Follow-up with Dr. Redmond Pulling as scheduled - RTC in 3  months or sooner as needed       [name] ,DO, FACG 06/20/2021, 8:44 AM

## 2021-06-20 NOTE — Patient Instructions (Addendum)
If you are age 37 or older, your body mass index should be between 23-30. Your Body mass index is 39.08 kg/m. If this is out of the aforementioned range listed, please consider follow up with your Primary Care Provider.  If you are age 67 or younger, your body mass index should be between 19-25. Your Body mass index is 39.08 kg/m. If this is out of the aformentioned range listed, please consider follow up with your Primary Care Provider.   __________________________________________________________  The [place] [facility] providers would like to encourage you to use [facility] to communicate with providers for non-urgent requests or questions.  Due to long hold times on the telephone, sending your provider a message by [facility] may be a faster and more efficient way to get a response.  Please allow 48 business hours for a response.  Please remember that this is for non-urgent requests.  ___________________________________________________________  Due to recent changes in healthcare laws, you may see the results of your imaging and laboratory studies on MyChart before your provider has had a chance to review them.  We understand that in some cases there may be results that are confusing or concerning to you. Not all laboratory results come back in the same time frame and the provider may be waiting for multiple results in order to interpret others.  Please give [place] 48 hours in order for your provider to thoroughly review all the results before contacting the office for clarification of your results.  _________________________________________________________  Please follow up in 3 months, an appointment has been scheduled for you on 09/19/2021 at 8:40.  Thank you for choosing me and [facility].  Vito Cirigliano, D.O.

## 2021-06-25 ENCOUNTER — Other Ambulatory Visit: Payer: Self-pay | Admitting: Surgery

## 2021-06-25 DIAGNOSIS — R21 Rash and other nonspecific skin eruption: Secondary | ICD-10-CM

## 2021-06-25 MED ORDER — PREDNISONE 10 MG PO TABS: 20.0000 mg | ORAL_TABLET | Freq: Every day | ORAL | 0 refills | Status: AC

## 2021-08-04 ENCOUNTER — Other Ambulatory Visit: Payer: Self-pay | Admitting: Gastroenterology

## 2021-08-04 NOTE — Telephone Encounter (Signed)
Spoke with the patient. Stopped taking Rabeprazole ( Aciphex) 20 mg tablet 3 to 4 weeks ago. Denied pharmacy refill request.

## 2021-09-19 ENCOUNTER — Encounter: Payer: Self-pay | Admitting: Gastroenterology

## 2021-09-19 ENCOUNTER — Other Ambulatory Visit: Payer: Self-pay

## 2021-09-19 ENCOUNTER — Ambulatory Visit (INDEPENDENT_AMBULATORY_CARE_PROVIDER_SITE_OTHER): Payer: BC Managed Care – PPO | Admitting: Gastroenterology

## 2021-09-19 VITALS — BP 126/82 | HR 76 | Ht 62.25 in | Wt 221.0 lb

## 2021-09-19 DIAGNOSIS — Z8719 Personal history of other diseases of the digestive system: Secondary | ICD-10-CM | POA: Diagnosis not present

## 2021-09-19 DIAGNOSIS — Z9889 Other specified postprocedural states: Secondary | ICD-10-CM | POA: Diagnosis not present

## 2021-09-19 DIAGNOSIS — K219 Gastro-esophageal reflux disease without esophagitis: Secondary | ICD-10-CM

## 2021-09-19 NOTE — Patient Instructions (Signed)
If you are age 37 or older, your body mass index should be between 23-30. Your Body mass index is 40.1 kg/m. If this is out of the aforementioned range listed, please consider follow up with your Primary Care Provider.  If you are age 16 or younger, your body mass index should be between 19-25. Your Body mass index is 40.1 kg/m. If this is out of the aformentioned range listed, please consider follow up with your Primary Care Provider.   ________________________________________________________  The [place] [facility] providers would like to encourage you to use [facility] to communicate with providers for non-urgent requests or questions.  Due to long hold times on the telephone, sending your provider a message by [facility] may be a faster and more efficient way to get a response.  Please allow 48 business hours for a response.  Please remember that this is for non-urgent requests.  _______________________________________________________  Please follow up in 12 months by calling our office to schedule a follow up appointment.  It was a pleasure to see you today!  Vito Cirigliano, D.O.

## 2021-09-19 NOTE — Progress Notes (Signed)
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  Was diagnosed with GERD ~age [age], with multiple acid suppression agents over the years.  Trialed high-dose pantoprazole (suboptimal response), Dexilant (good response, but cost prohibitive), and Aciphex 20 mg bid.  Had intermittent breakthrough symptoms despite high-dose PPI and opted for laparoscopic hiatal repair and TIF on 06/08/2021 as a means to better control reflux and stop or significantly reduce need for acid suppression therapy.  She is an avid cyclist, cycling 75-80 miles per week.  Regular exercise, going to the gym 6 days/week.     GERD history: -Index symptoms: Regurgitation -Exacerbating features: Post prandial independent of food types -Medications trialed: Zantac, Nexium, Prilosec, pantoprazole, Pepcid, Dexilant, Aciphex -Current medications: Zantac as needed (rare use) -Complications: Hiatal hernia, enamel erosion   GERD evaluation: -Last EGD:  06/2020 -Barium esophagram: None -Esophageal Manometry: 10/2020: Normal -pH/Impedance: reports this was done ~1999/2000. No report for review.  -Bravo:  06/2020:  Increase esophageal acid exposure with % time pH<4 5.0%.  DeMeester score 18.8 (normal <14.72). Esophageal exposure is abnormal in supine position. No symptoms reported for symptom correlation with reflux events based on SAP. C/w significant gastroesophageal reflux -GES: 03/2001: Normal - Laparoscopic hiatal hernia repair and Transoral Incisionless Fundoplication (cTIF) on 5/39/7673   Endoscopic History: - EGD with TIF (06/08/2021, Dr. Bryan Lemma) with 26 Serofuse fasteners placed -EGD (06/2020, Dr. Bryan Lemma): Hill grade 3 valve, 1-2 cm axial height x 3 cm transverse width hiatal hernia, fundic gland polyps. Bravo placed -EGD (04/2017, Dr. Oletta Lamas): Fundic gland polyps, medium sized hiatal hernia, no  Barrett's -EGD (2010, Dr. Oletta Lamas): Normal esophagus, normal small bowel with biopsies negative for Celiac -EGD (2000): Esophagitis per patient. No report for review  HPI:     Patient is a 35 y.o. female presenting to the Gastroenterology Clinic for follow-up.  Last seen 06/20/2021 for postoperative follow-up.  No issues at that time.  She has since was weaned off Aciphex without any recurrence of reflux symptoms.  Follow-up with Dr. Redmond Pulling on 07/05/2021 with plan to follow-up prn.  Today, she states she feels well and without any complaints.  Had 3 episodes of indigestion which each occurred after overeating for Thanksgiving, Christmas, and brother's birthday.  Symptoms were brief and resolved with Zantac  prn.  Otherwise has been without any reflux symptoms.  Back to exercising as she was prior to surgery.  Otherwise tolerating all p.o. intake without issue.  Very happy with the results of her surgery.  No new labs or abdominal imaging for review today.  Review of systems:     No chest pain, no SOB, no fevers, no urinary sx   Past Medical History:  Diagnosis Date   Allergy    Asthma    GERD (gastroesophageal reflux disease)    History of hiatal hernia    Obesity     Patient's surgical history, family medical history, social history, medications and allergies were all reviewed in Epic    Current Outpatient Medications  Medication Sig Dispense Refill   albuterol (VENTOLIN HFA) 108 (90 Base) MCG/ACT inhaler Inhale 2 puffs into the lungs every 6 (six) hours as needed for shortness of breath or wheezing.     calcium-vitamin D 250-100 MG-UNIT tablet Take 1 tablet by mouth daily.     cetirizine (ZYRTEC) 10 MG tablet Take 10 mg by mouth daily.     JUNEL FE 1.5/30 1.5-30 MG-MCG tablet  Take 1 tablet by mouth daily.     metoCLOPramide (REGLAN) 10 MG tablet Take 1 tablet (10 mg total) by mouth every 8 (eight) hours as needed for nausea. 30 tablet 1   mometasone-formoterol (DULERA) 200-5  MCG/ACT AERO Inhale 2 puffs into the lungs 2 (two) times daily as needed for wheezing or shortness of breath.     montelukast (SINGULAIR) 10 MG tablet Take 10 mg by mouth at bedtime.     Multiple Vitamin (MULTIVITAMIN) tablet Take 1 tablet by mouth daily.     No current facility-administered medications for this visit.    Physical Exam:     BP 126/82    Pulse 76    Ht 5' 2.25" (1.581 m)    Wt 221 lb (100.2 kg)    SpO2 99%    BMI 40.10 kg/m   GENERAL:  Pleasant female in NAD PSYCH: : Cooperative, normal affect Musculoskeletal:  Normal muscle tone, normal strength NEURO: Alert and oriented x 3, no focal neurologic deficits   IMPRESSION and PLAN:    1) History of GERD s/p cTIF 05/2021 2) History of hiatal hernia now s/p hiatal hernia repair - Doing very well postoperatively - Ok to use Zantac prn - Tolerating all p.o. intake without issue - Continue regular exercise program - Avoid overeating  - RTC in 12 months or sooner as needed            [name] ,DO, FACG 09/19/2021, 8:53 AM

## 2021-10-31 DIAGNOSIS — Z0000 Encounter for general adult medical examination without abnormal findings: Secondary | ICD-10-CM | POA: Diagnosis not present

## 2021-10-31 DIAGNOSIS — Z13220 Encounter for screening for lipoid disorders: Secondary | ICD-10-CM | POA: Diagnosis not present

## 2022-05-17 DIAGNOSIS — J3489 Other specified disorders of nose and nasal sinuses: Secondary | ICD-10-CM | POA: Diagnosis not present

## 2022-10-05 ENCOUNTER — Ambulatory Visit: Payer: BC Managed Care – PPO | Admitting: Gastroenterology

## 2022-11-07 DIAGNOSIS — Z124 Encounter for screening for malignant neoplasm of cervix: Secondary | ICD-10-CM | POA: Diagnosis not present

## 2022-11-07 DIAGNOSIS — J45909 Unspecified asthma, uncomplicated: Secondary | ICD-10-CM | POA: Diagnosis not present

## 2022-11-07 DIAGNOSIS — Z0000 Encounter for general adult medical examination without abnormal findings: Secondary | ICD-10-CM | POA: Diagnosis not present

## 2022-11-07 DIAGNOSIS — Z13220 Encounter for screening for lipoid disorders: Secondary | ICD-10-CM | POA: Diagnosis not present

## 2022-11-07 DIAGNOSIS — Z309 Encounter for contraceptive management, unspecified: Secondary | ICD-10-CM | POA: Diagnosis not present

## 2022-11-07 LAB — COMPREHENSIVE METABOLIC PANEL: EGFR: 93

## 2022-11-09 ENCOUNTER — Ambulatory Visit: Payer: BC Managed Care – PPO | Admitting: Gastroenterology

## 2022-11-20 ENCOUNTER — Ambulatory Visit: Payer: BC Managed Care – PPO | Admitting: Gastroenterology

## 2022-11-20 ENCOUNTER — Encounter: Payer: Self-pay | Admitting: Gastroenterology

## 2022-11-20 VITALS — BP 122/90 | HR 86 | Ht 62.0 in | Wt 226.0 lb

## 2022-11-20 DIAGNOSIS — Z9889 Other specified postprocedural states: Secondary | ICD-10-CM

## 2022-11-20 DIAGNOSIS — K219 Gastro-esophageal reflux disease without esophagitis: Secondary | ICD-10-CM | POA: Diagnosis not present

## 2022-11-20 NOTE — Progress Notes (Signed)
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  Was diagnosed with GERD ~age [age], with multiple acid suppression agents over the years.  Trialed high-dose pantoprazole (suboptimal response), Dexilant (good response, but cost prohibitive), and Aciphex 20 mg bid.  Had intermittent breakthrough symptoms despite high-dose PPI and opted for laparoscopic hiatal repair and TIF on 06/08/2021 as a means to better control reflux and stop or significantly reduce need for acid suppression therapy.  She is an avid cyclist, cycling 75-80 miles per week.  Regular exercise, going to the gym 6 days/week.     GERD history: -Index symptoms: Regurgitation -Exacerbating features: Post prandial independent of food types -Medications trialed: Zantac, Nexium, Prilosec, pantoprazole, Pepcid, Dexilant, Aciphex -Current medications: Zantac as needed (rare use) -Complications: Hiatal hernia, enamel erosion   GERD evaluation: -Last EGD:  06/2020 -Barium esophagram: None -Esophageal Manometry: 10/2020: Normal -pH/Impedance: reports this was done ~1999/2000. No report for review.  -Bravo:  06/2020:  Increase esophageal acid exposure with % time pH<4 5.0%.  DeMeester score 18.8 (normal <14.72). Esophageal exposure is abnormal in supine position. No symptoms reported for symptom correlation with reflux events based on SAP. C/w significant gastroesophageal reflux -GES: 03/2001: Normal - Laparoscopic hiatal hernia repair and Transoral Incisionless Fundoplication (cTIF) on [id number]   Endoscopic History: - EGD with TIF (06/08/2021, Dr. Bryan Lemma) with 26 Serofuse fasteners placed -EGD (06/2020, Dr. Bryan Lemma): Hill grade 3 valve, 1-2 cm axial height x 3 cm transverse width hiatal hernia, fundic gland polyps. Bravo placed -EGD (04/2017, Dr. Oletta Lamas): Fundic gland polyps, medium sized hiatal hernia, no Barrett's -EGD  (2010, Dr. Oletta Lamas): Normal esophagus, normal small bowel with biopsies negative for Celiac -EGD (2000): Esophagitis per patient. No report for review  HPI:     Patient is a 37 y.o. female presenting to the Gastroenterology Clinic for follow-up.  Last seen by me on 09/19/2021 following cTIF 06/08/2021.  At that time, was doing well, had completely weaned off PPI without any breakthrough reflux symptoms.  Was back to exercising and tolerating all p.o. intake without issues.  Today, she states she still feels well and no breakthrough reflux symptoms.  No GI symptoms at all.  Not requiring any acid suppression therapy.  She is overall very happy with the surgery.  No new labs or abdominal imaging for review.  Review of systems:     No chest pain, no SOB, no fevers, no urinary sx   Past Medical History:  Diagnosis Date   Allergy    Asthma    GERD (gastroesophageal reflux disease)    History of hiatal hernia    Obesity     Patient's surgical history, family medical history, social history, medications and allergies were all reviewed in Epic    Current Outpatient Medications  Medication Sig Dispense Refill   albuterol (VENTOLIN HFA) 108 (90 Base) MCG/ACT inhaler Inhale 2 puffs into the lungs every 6 (six) hours as needed for shortness of breath or wheezing.     calcium-vitamin D 250-100 MG-UNIT tablet Take 1 tablet by mouth daily.     cetirizine (ZYRTEC) 10 MG tablet Take 10 mg by mouth daily.     JUNEL FE 1.5/30 1.5-30 MG-MCG tablet Take 1 tablet by mouth daily.     mometasone-formoterol (DULERA) 200-5 MCG/ACT AERO Inhale 2 puffs into the lungs 2 (two) times daily as needed for wheezing or shortness of breath.     montelukast (SINGULAIR)  10 MG tablet Take 10 mg by mouth at bedtime.     Multiple Vitamin (MULTIVITAMIN) tablet Take 1 tablet by mouth daily.     No current facility-administered medications for this visit.    Physical Exam:     BP (!) 122/90   Pulse 86   Ht '5\' 2"'$   (1.575 m)   Wt 226 lb (102.5 kg)   BMI 41.34 kg/m   GENERAL:  Pleasant female in NAD PSYCH: : Cooperative, normal affect CARDIAC:  RRR, no murmur heard, no peripheral edema PULM: Normal respiratory effort, lungs CTA bilaterally, no wheezing ABDOMEN:  Nondistended NEURO: Alert and oriented x 3, no focal neurologic deficits   IMPRESSION and PLAN:    1) History of GERD s/p cTIF 05/2021 2) History of hiatal hernia now s/p hiatal hernia repair - Doing very well postoperatively.  No breakthrough reflux symptoms.  She is very happy with the surgical results.   - RTC prn          [name] ,DO, FACG 11/20/2022, 8:24 AM

## 2022-11-20 NOTE — Patient Instructions (Addendum)
Follow up as needed.  _______________________________________________________  If your blood pressure at your visit was 140/90 or greater, please contact your primary care physician to follow up on this.  _______________________________________________________  If you are age 39 or older, your body mass index should be between 23-30. Your Body mass index is 41.34 kg/m. If this is out of the aforementioned range listed, please consider follow up with your Primary Care Provider.  If you are age 74 or younger, your body mass index should be between 19-25. Your Body mass index is 41.34 kg/m. If this is out of the aformentioned range listed, please consider follow up with your Primary Care Provider.   __________________________________________________________  The [place] [facility] providers would like to encourage you to use [facility] to communicate with providers for non-urgent requests or questions.  Due to long hold times on the telephone, sending your provider a message by [facility] may be a faster and more efficient way to get a response.  Please allow 48 business hours for a response.  Please remember that this is for non-urgent requests.   Due to recent changes in healthcare laws, you may see the results of your imaging and laboratory studies on MyChart before your provider has had a chance to review them.  We understand that in some cases there may be results that are confusing or concerning to you. Not all laboratory results come back in the same time frame and the provider may be waiting for multiple results in order to interpret others.  Please give [place] 48 hours in order for your provider to thoroughly review all the results before contacting the office for clarification of your results.    Thank you for choosing me and [facility].  Vito Cirigliano, D.O.

## 2022-11-29 DIAGNOSIS — L0211 Cutaneous abscess of neck: Secondary | ICD-10-CM | POA: Diagnosis not present

## 2022-12-10 ENCOUNTER — Encounter: Payer: Self-pay | Admitting: Nurse Practitioner

## 2022-12-10 ENCOUNTER — Ambulatory Visit (INDEPENDENT_AMBULATORY_CARE_PROVIDER_SITE_OTHER): Payer: BC Managed Care – PPO | Admitting: Nurse Practitioner

## 2022-12-10 VITALS — BP 125/85 | HR 84 | Temp 97.9°F | Ht 62.0 in | Wt 217.0 lb

## 2022-12-10 DIAGNOSIS — E669 Obesity, unspecified: Secondary | ICD-10-CM | POA: Diagnosis not present

## 2022-12-10 DIAGNOSIS — J45909 Unspecified asthma, uncomplicated: Secondary | ICD-10-CM | POA: Diagnosis not present

## 2022-12-10 DIAGNOSIS — Z0289 Encounter for other administrative examinations: Secondary | ICD-10-CM

## 2022-12-10 DIAGNOSIS — Z6839 Body mass index (BMI) 39.0-39.9, adult: Secondary | ICD-10-CM | POA: Diagnosis not present

## 2022-12-10 NOTE — Progress Notes (Signed)
Office: [phone]  /  Fax: [phone]   Initial Visit  Sara Bryant was seen in clinic today to evaluate for obesity. She is interested in losing weight to improve overall health and reduce the risk of weight related complications. She presents today to review program treatment options, initial physical assessment, and evaluation.     She was referred by: PCP  When asked what else they would like to accomplish? She states: Lose a target amount of weight : would like to lose 30-40 lbs  Weight history:  She started gaining weight when she was a teenager. Her weight hasn't changed over the past 10 years.  She goes to the gym 7 days per week. Averages around 1700-1800 calories.    Some associated conditions: asthma, GERD, Hiatal hernia (repaired 06/08/21)  Contributing factors: Other: She was adopted.  Weight promoting medications identified: None  Current nutrition plan: None  Current level of physical activity: None and Other: 3 days weight lifting, 4 days bike 16-17 miles, running 2-2.5 miles  Current or previous pharmacotherapy: None  Response to medication: Never tried medications   Past medical history includes:   Past Medical History:  Diagnosis Date   Allergy    Asthma    GERD (gastroesophageal reflux disease)    History of hiatal hernia    Obesity      Objective:   BP 125/85   Pulse 84   Temp 97.9 F (36.6 C)   Ht 5\' 2"  (1.575 m)   Wt 217 lb (98.4 kg)   LMP 11/21/2022 (Approximate)   SpO2 100%   BMI 39.69 kg/m  She was weighed on the bioimpedance scale: Body mass index is 39.69 kg/m.  Peak Weight:228 lbs , Body Fat%:45.4, Visceral Fat Rating:12, Weight trend over the last 12 months: Unchanged  General:  Alert, oriented and cooperative. Patient is in no acute distress.  Respiratory: Normal respiratory effort, no problems with respiration noted   Gait: able to ambulate independently  Mental Status: Normal mood and affect. Normal behavior. Normal  judgment and thought content.   DIAGNOSTIC DATA REVIEWED:  BMET    Component Value Date/Time   NA 140 06/02/2021 0842   K 4.5 06/02/2021 0842   CL 106 06/02/2021 0842   CO2 25 06/02/2021 0842   GLUCOSE 85 06/02/2021 0842   BUN 11 06/02/2021 0842   CREATININE 0.77 06/02/2021 0842   CALCIUM 9.3 06/02/2021 0842   GFRNONAA >60 06/02/2021 0842   No results found for: "HGBA1C" No results found for: "INSULIN" CBC    Component Value Date/Time   WBC 7.3 06/02/2021 0842   RBC 5.07 06/02/2021 0842   HGB 14.7 06/02/2021 0842   HCT 45.8 06/02/2021 0842   PLT 280 06/02/2021 0842   MCV 90.3 06/02/2021 0842   MCH 29.0 06/02/2021 0842   MCHC 32.1 06/02/2021 0842   RDW 13.4 06/02/2021 0842   Iron/TIBC/Ferritin/ %Sat No results found for: "IRON", "TIBC", "FERRITIN", "IRONPCTSAT" Lipid Panel  No results found for: "CHOL", "TRIG", "HDL", "CHOLHDL", "VLDL", "LDLCALC", "LDLDIRECT" Hepatic Function Panel     Component Value Date/Time   PROT 7.7 06/02/2021 0842   ALBUMIN 3.7 06/02/2021 0842   AST 25 06/02/2021 0842   ALT 25 06/02/2021 0842   ALKPHOS 62 06/02/2021 0842   BILITOT 0.9 06/02/2021 0842   No results found for: "TSH"   Assessment and Plan:   Mild asthma without complication, unspecified whether persistent Continue to follow up with PCP.  Continue medications as directed.  Generalized obesity Will obtain labs needed at next visit.    BMI 39.0-39.9,adult        Obesity Treatment / Action Plan:  Patient will work on garnering support from family and friends to begin weight loss journey. Will work on eliminating or reducing the presence of highly palatable, calorie dense foods in the home. Will complete provided nutritional and psychosocial assessment questionnaire before the next appointment. Will be scheduled for indirect calorimetry to determine resting energy expenditure in a fasting state.  This will allow [place] to create a reduced calorie, high-protein meal plan  to promote loss of fat mass while preserving muscle mass.  Obesity Education Performed Today:  She was weighed on the bioimpedance scale and results were discussed and documented in the synopsis.  We discussed obesity as a disease and the importance of a more detailed evaluation of all the factors contributing to the disease.  We discussed the importance of long term lifestyle changes which include nutrition, exercise and behavioral modifications as well as the importance of customizing this to her specific health and social needs.  We discussed the benefits of reaching a healthier weight to alleviate the symptoms of existing conditions and reduce the risks of the biomechanical, metabolic and psychological effects of obesity.  Daanya L Campanaro appears to be in the action stage of change and states they are ready to start intensive lifestyle modifications and behavioral modifications.  30 minutes was spent today on this visit including the above counseling, pre-visit chart review, and post-visit documentation.  Reviewed by clinician on day of visit: allergies, medications, problem list, medical history, surgical history, family history, social history, and previous encounter notes pertinent to obesity diagnosis.    Ailene Rud Owen Pratte FNP-C

## 2023-01-02 ENCOUNTER — Encounter: Payer: Self-pay | Admitting: Bariatrics

## 2023-01-02 ENCOUNTER — Ambulatory Visit (INDEPENDENT_AMBULATORY_CARE_PROVIDER_SITE_OTHER): Payer: BC Managed Care – PPO | Admitting: Bariatrics

## 2023-01-02 VITALS — BP 126/86 | HR 78 | Temp 97.5°F | Ht 62.0 in | Wt 214.0 lb

## 2023-01-02 DIAGNOSIS — J45909 Unspecified asthma, uncomplicated: Secondary | ICD-10-CM | POA: Diagnosis not present

## 2023-01-02 DIAGNOSIS — R0602 Shortness of breath: Secondary | ICD-10-CM | POA: Diagnosis not present

## 2023-01-02 DIAGNOSIS — L658 Other specified nonscarring hair loss: Secondary | ICD-10-CM | POA: Diagnosis not present

## 2023-01-02 DIAGNOSIS — E559 Vitamin D deficiency, unspecified: Secondary | ICD-10-CM | POA: Diagnosis not present

## 2023-01-02 DIAGNOSIS — Z1331 Encounter for screening for depression: Secondary | ICD-10-CM | POA: Diagnosis not present

## 2023-01-02 DIAGNOSIS — Z0000 Encounter for general adult medical examination without abnormal findings: Secondary | ICD-10-CM

## 2023-01-02 DIAGNOSIS — R5383 Other fatigue: Secondary | ICD-10-CM

## 2023-01-02 DIAGNOSIS — E161 Other hypoglycemia: Secondary | ICD-10-CM

## 2023-01-02 DIAGNOSIS — Z6839 Body mass index (BMI) 39.0-39.9, adult: Secondary | ICD-10-CM

## 2023-01-02 DIAGNOSIS — E669 Obesity, unspecified: Secondary | ICD-10-CM

## 2023-01-03 NOTE — Progress Notes (Signed)
Chief Complaint:   OBESITY Sara Bryant (MR# [id number]) is a 37 y.o. female who presents for evaluation and treatment of obesity and related comorbidities. Current BMI is Body mass index is 39.14 kg/m. Sara Bryant has been struggling with her weight for many years and has been unsuccessful in either losing weight, maintaining weight loss, or reaching her healthy weight goal.  She was seen by Irene LimboStephanie Tickerhoff, FNP on 12/10/2022.  She is exercising and eating properly.  She is journaling in "My [employer] Diary".  Sara Bryant is currently in the action stage of change and ready to dedicate time achieving and maintaining a healthier weight. Sara Bryant is interested in becoming our patient and working on intensive lifestyle modifications including (but not limited to) diet and exercise for weight loss.  Sara Bryant's habits were reviewed today and are as follows: she thinks her family will eat healthier with her, her desired weight loss is 54 lbs, she has been heavy most of her life, she started gaining weight in her teens, her heaviest weight ever was 225 pounds, and she is frequently drinking liquids with calories.  Depression Screen Sara Bryant Food and Mood (modified PHQ-9) score was 1.  Subjective:   1. Other fatigue Sara Bryant denies daytime somnolence and denies waking up still tired. Patient has a history of symptoms of n/a. Sara Bryant generally gets about 6-9  hours of sleep per night, and states that she has generally restful sleep. Snoring is not present. Apneic episodes are not present. Epworth Sleepiness Score is 2.   2. SOB (shortness of breath) on exertion Sara Bryant notes increasing shortness of breath with exercising and seems to be worsening over time with weight gain. She notes getting out of breath sooner with activity than she used to. This has not gotten worse recently. Sara Bryant denies shortness of breath at rest or orthopnea.  3. Mild asthma without complication, unspecified whether persistent Patient is taking  inhalers, Dulera, Singulair, albuterol.  Asthma is controlled.  4. Vitamin D deficiency Patient is taking a multivitamin, calcium and vitamin D.  5. Health care maintenance Obesity.  6. Reactive hypoglycemia Patient's glucose is okay.  7. Female pattern baldness Typical female baldness features with central thinning.   Assessment/Plan:   1. Other fatigue Sara Bryant does feel that her weight is causing her energy to be lower than it should be. Fatigue may be related to obesity, depression or many other causes. Labs will be ordered, and in the meanwhile, Sara Bryant will focus on self care including making healthy food choices, increasing physical activity and focusing on stress reduction.  - EKG 12-Lead - Vitamin B12  2. SOB (shortness of breath) on exertion Sara Bryant does feel that she gets out of breath more easily that she used to when she exercises. Sara Bryant's shortness of breath appears to be obesity related and exercise induced. She has agreed to work on weight loss and gradually increase exercise to treat her exercise induced shortness of breath. Will continue to monitor closely.  - Vitamin B12  3. Mild asthma without complication, unspecified whether persistent Continue inhalers.  4. Vitamin D deficiency Check labs today.  - VITAMIN D 25 Hydroxy (Vit-D Deficiency, Fractures)  5. Health care maintenance Check EKG, labs and IC today.  - Hemoglobin A1c - Insulin, random - Vitamin B12 - VITAMIN D 25 Hydroxy (Vit-D Deficiency, Fractures) - Cortisol - Testosterone,Free and Total  6. Reactive hypoglycemia Check labs today.  - Hemoglobin A1c - Insulin, random  7. Female pattern baldness Check labs today.  -  Testosterone,Free and Total  8. Depression screening Sara Bryant had a negative depression screening.   9. Generalized obesity  10. BMI 39.0-39.9,adult Labs reviewed today from 11/08/2022.  CMP, lipid, CBC, TSH.  Sara Bryant is currently in the action stage of change and her goal  is to continue with weight loss efforts. I recommend Sara Bryant begin the structured treatment plan as follows:  She has agreed to the Category 3 Plan and keeping a food journal and adhering to recommended goals of 1600 calories and 100 protein.  Exercise goals: All adults should avoid inactivity. Some physical activity is better than none, and adults who participate in any amount of physical activity gain some health benefits.   Behavioral modification strategies: increasing lean protein intake, decreasing simple carbohydrates, increasing vegetables, increasing water intake, decreasing eating out, no skipping meals, meal planning and cooking strategies, keeping healthy foods in the home, emotional eating strategies, and planning for success.  She was informed of the importance of frequent follow-up visits to maximize her success with intensive lifestyle modifications for her multiple health conditions. She was informed we would discuss her lab results at her next visit unless there is a critical issue that needs to be addressed sooner. Sara Bryant agreed to keep her next visit at the agreed upon time to discuss these results.  Objective:   Blood pressure 126/86, pulse 78, temperature (!) 97.5 F (36.4 C), height 5\' 2"  (1.575 m), weight 214 lb (97.1 kg), last menstrual period 11/21/2022, SpO2 98 %. Body mass index is 39.14 kg/m.  EKG: Normal sinus rhythm, rate 82 bpm.  Indirect Calorimeter completed today shows a VO2 of 323 and a REE of 2232.  Her calculated basal metabolic rate is 9292 thus her basal metabolic rate is better than expected.  General: Cooperative, alert, well developed, in no acute distress. HEENT: Conjunctivae and lids unremarkable. Cardiovascular: Regular rhythm.  Lungs: Normal work of breathing. Neurologic: No focal deficits.   Lab Results  Component Value Date   CREATININE 0.77 06/02/2021   BUN 11 06/02/2021   NA 140 06/02/2021   K 4.5 06/02/2021   CL 106 06/02/2021   CO2 25  06/02/2021   Lab Results  Component Value Date   ALT 25 06/02/2021   AST 25 06/02/2021   ALKPHOS 62 06/02/2021   BILITOT 0.9 06/02/2021   No results found for: "HGBA1C" No results found for: "INSULIN" No results found for: "TSH" No results found for: "CHOL", "HDL", "LDLCALC", "LDLDIRECT", "TRIG", "CHOLHDL" Lab Results  Component Value Date   WBC 7.3 06/02/2021   HGB 14.7 06/02/2021   HCT 45.8 06/02/2021   MCV 90.3 06/02/2021   PLT 280 06/02/2021   No results found for: "IRON", "TIBC", "FERRITIN"  Attestation Statements:   Reviewed by clinician on day of visit: allergies, medications, problem list, medical history, surgical history, family history, social history, and previous encounter notes.  Silvana Newness, am acting as Energy manager for [employer], DO.  I have reviewed the above documentation for accuracy and completeness, and I agree with the above. Corinna Capra, DO

## 2023-01-05 LAB — HEMOGLOBIN A1C
Est. average glucose Bld gHb Est-mCnc: 100 mg/dL
Hgb A1c MFr Bld: 5.1 % (ref 4.8–5.6)

## 2023-01-05 LAB — VITAMIN D 25 HYDROXY (VIT D DEFICIENCY, FRACTURES): Vit D, 25-Hydroxy: 52 ng/mL (ref 30.0–100.0)

## 2023-01-05 LAB — VITAMIN B12: Vitamin B-12: 517 pg/mL (ref 232–1245)

## 2023-01-05 LAB — INSULIN, RANDOM: INSULIN: 9.5 u[IU]/mL (ref 2.6–24.9)

## 2023-01-05 LAB — TESTOSTERONE,FREE AND TOTAL
Testosterone, Free: 1.4 pg/mL (ref 0.0–4.2)
Testosterone: 47 ng/dL (ref 8–60)

## 2023-01-05 LAB — CORTISOL: Cortisol: 27.3 ug/dL — ABNORMAL HIGH (ref 6.2–19.4)

## 2023-01-07 ENCOUNTER — Encounter (INDEPENDENT_AMBULATORY_CARE_PROVIDER_SITE_OTHER): Payer: Self-pay | Admitting: Bariatrics

## 2023-01-07 DIAGNOSIS — R7989 Other specified abnormal findings of blood chemistry: Secondary | ICD-10-CM | POA: Insufficient documentation

## 2023-01-08 ENCOUNTER — Encounter: Payer: Self-pay | Admitting: Bariatrics

## 2023-01-16 ENCOUNTER — Ambulatory Visit: Payer: BC Managed Care – PPO | Admitting: Nurse Practitioner

## 2023-01-16 ENCOUNTER — Encounter: Payer: Self-pay | Admitting: Nurse Practitioner

## 2023-01-16 VITALS — BP 130/90 | HR 71 | Temp 97.7°F | Ht 62.0 in | Wt 218.0 lb

## 2023-01-16 DIAGNOSIS — Z6839 Body mass index (BMI) 39.0-39.9, adult: Secondary | ICD-10-CM | POA: Diagnosis not present

## 2023-01-16 DIAGNOSIS — J301 Allergic rhinitis due to pollen: Secondary | ICD-10-CM

## 2023-01-16 DIAGNOSIS — E669 Obesity, unspecified: Secondary | ICD-10-CM

## 2023-01-16 NOTE — Progress Notes (Signed)
Office: [phone]  /  Fax: [phone]  WEIGHT SUMMARY AND BIOMETRICS  No data recorded Weight Gained Since Last Visit: 4lb   Vitals Temp: 97.7 F (36.5 C) BP: (!) 130/90 Pulse Rate: 71 SpO2: 100 %   Anthropometric Measurements Height:  (1.575 m) Weight: 218 lb (98.9 kg) BMI (Calculated): 39.86 Weight at Last Visit: 214lb Weight Gained Since Last Visit: 4lb Starting Weight: 214lb Total Weight Loss (lbs): 0 lb (0 kg)   Body Composition  Body Fat %: 45.9 % Fat Mass (lbs): 100.2 lbs Muscle Mass (lbs): 112 lbs Total Body Water (lbs): 82.6 lbs Visceral Fat Rating : 12   Other Clinical Data Fasting: Yes Today's Visit #: 2 Starting Date: 01/02/23     HPI  Chief Complaint: OBESITY  Sara Bryant is here to discuss her progress with her obesity treatment plan. She is on the the Category 3 Plan and states she is following her eating plan approximately 100 % of the time. She states she is exercising 60 minutes 5 days per week.   Interval History:  Since last office visit she has gained 4 pounds.  She doesn't like the meal plan . She feels "weighed down".  If she eats the wrong things she "feels heav".. She struggles with eating a lot of protein.  Her energy is low.  BF:  shake 400 cal and 30 grams of protein, snack: none, lunch: chicken salad sandwich, snack: none, dinner: cereal or oatmeal or grits.     Pharmacotherapy for weight loss: She is not currently taking medications  for medical weight loss.    Previous pharmacotherapy for medical weight loss:  none  Bariatric surgery:  Patient has not had bariatric surgery  Allergies/asthma Taking Singulair and Zyrtec.  Doing well   PHYSICAL EXAM:  Blood pressure (!) 130/90, pulse 71, temperature 97.7 F (36.5 C), height  (1.575 m), weight 218 lb (98.9 kg), SpO2 100 %. Body mass index is 39.87 kg/m.  General: She is overweight, cooperative, alert, well developed, and in no acute distress. PSYCH: Has  normal mood, affect and thought process.   Extremities: No edema.  Neurologic: No gross sensory or motor deficits. No tremors or fasciculations noted.    DIAGNOSTIC DATA REVIEWED:  BMET    Component Value Date/Time   NA 140 06/02/2021 0842   K 4.5 06/02/2021 0842   CL 106 06/02/2021 0842   CO2 25 06/02/2021 0842   GLUCOSE 85 06/02/2021 0842   BUN 11 06/02/2021 0842   CREATININE 0.77 06/02/2021 0842   CALCIUM 9.3 06/02/2021 0842   GFRNONAA >60 06/02/2021 0842   Lab Results  Component Value Date   HGBA1C 5.1 01/02/2023   Lab Results  Component Value Date   INSULIN 9.5 01/02/2023   No results found for: "TSH" CBC    Component Value Date/Time   WBC 7.3 06/02/2021 0842   RBC 5.07 06/02/2021 0842   HGB 14.7 06/02/2021 0842   HCT 45.8 06/02/2021 0842   PLT 280 06/02/2021 0842   MCV 90.3 06/02/2021 0842   MCH 29.0 06/02/2021 0842   MCHC 32.1 06/02/2021 0842   RDW 13.4 06/02/2021 0842   Iron Studies No results found for: "IRON", "TIBC", "FERRITIN", "IRONPCTSAT" Lipid Panel  No results found for: "CHOL", "TRIG", "HDL", "CHOLHDL", "VLDL", "LDLCALC", "LDLDIRECT" Hepatic Function Panel     Component Value Date/Time   PROT 7.7 06/02/2021 0842   ALBUMIN 3.7 06/02/2021 0842   AST 25 06/02/2021 0842   ALT 25 06/02/2021 0842  ALKPHOS 62 06/02/2021 0842   BILITOT 0.9 06/02/2021 0842   No results found for: "TSH" Nutritional Lab Results  Component Value Date   VD25OH 52.0 01/02/2023     ASSESSMENT AND PLAN  TREATMENT PLAN FOR OBESITY:  Recommended Dietary Goals  Sara Bryant is currently in the action stage of change. As such, her goal is to continue weight management plan. She has agreed to keeping a food journal and adhering to recommended goals of 1500-1600 calories and 90 protein.  Needs to increase her protein intake.  I've recommended that she weigh, measure and log food so I can review her macros at her next visit.  She is not eating enough protein and is eating too  many carbs.  Her body fast % and fat mass is increasing and her muscle mass is starting to decrease.    Behavioral Intervention  We discussed the following Behavioral Modification Strategies today: increasing lean protein intake, decreasing simple carbohydrates , increasing vegetables, increasing lower glycemic fruits, increasing fiber rich foods, avoiding skipping meals, increasing water intake, continue to practice mindfulness when eating, and planning for success.  Additional resources provided today: NA  Recommended Physical Activity Goals  Sara Bryant has been advised to work up to 150 minutes of moderate intensity aerobic activity a week and strengthening exercises 2-3 times per week for cardiovascular health, weight loss maintenance and preservation of muscle mass.   She has agreed to Continue current level of physical activity     ASSOCIATED CONDITIONS ADDRESSED TODAY  Action/Plan  Seasonal allergic rhinitis due to pollen Continue to follow up with PCP.  Continue meds as directed.   Generalized obesity  BMI 39.0-39.9,adult       Labs reviewed in chart with patient from 01/02/23  Cortisol level is elevated.  Will continue to monitor.    Return in about 2 weeks (around 01/30/2023).Marland Kitchen She was informed of the importance of frequent follow up visits to maximize her success with intensive lifestyle modifications for her multiple health conditions.   ATTESTASTION STATEMENTS:  Reviewed by clinician on day of visit: allergies, medications, problem list, medical history, surgical history, family history, social history, and previous encounter notes.   Time spent on visit including pre-visit chart review and post-visit care and charting was 30 minutes.    Theodis Sato. Rosmery Duggin FNP-C

## 2023-02-11 ENCOUNTER — Ambulatory Visit: Payer: BC Managed Care – PPO | Admitting: Nurse Practitioner

## 2023-02-11 ENCOUNTER — Encounter: Payer: Self-pay | Admitting: Nurse Practitioner

## 2023-02-11 VITALS — BP 131/86 | HR 81 | Temp 97.7°F | Ht 62.0 in | Wt 216.0 lb

## 2023-02-11 DIAGNOSIS — E669 Obesity, unspecified: Secondary | ICD-10-CM

## 2023-02-11 DIAGNOSIS — Z6839 Body mass index (BMI) 39.0-39.9, adult: Secondary | ICD-10-CM | POA: Diagnosis not present

## 2023-02-11 DIAGNOSIS — E161 Other hypoglycemia: Secondary | ICD-10-CM

## 2023-02-11 NOTE — Progress Notes (Signed)
Office: [phone]  /  Fax: [phone]  WEIGHT SUMMARY AND BIOMETRICS  Weight Lost Since Last Visit: 2lb  No data recorded  Vitals Temp: 97.7 F (36.5 C) BP: 131/86 Pulse Rate: 81 SpO2: 99 %   Anthropometric Measurements Height: 5\' 2"  (1.575 m) Weight: 216 lb (98 kg) BMI (Calculated): 39.5 Weight at Last Visit: 218lb Weight Lost Since Last Visit: 2lb Starting Weight: 214lb Total Weight Loss (lbs): 0 lb (0 kg)   Body Composition  Body Fat %: 44.8 % Fat Mass (lbs): 96.8 lbs Muscle Mass (lbs): 113.4 lbs Total Body Water (lbs): 81.4 lbs Visceral Fat Rating : 12   Other Clinical Data Fasting: Yes Labs: No Today's Visit #: 3 Starting Date: 01/02/23     HPI  Chief Complaint: OBESITY  Sara Bryant is here to discuss her progress with her obesity treatment plan. She is on the the Category 3 Plan and states she is following her eating plan approximately 100 % of the time. She states she is exercising 45+ minutes 7 days per week.   Interval History:  Since last office visit she has lost 2 pounds.  She is tracking using mynetdiary.  She is averaging around 1400-1700 calories, 74-122 grams of protein  and 161-215 carbs.   She notes if she doesn't eat higher carbs she will get dizzy and will pass out.  If she doesn't eat certain things her BS will bottom out.  Carbs:  milk, rice, cereal, grits, oatmeal, bread. She is drinking water daily.     Pharmacotherapy for weight loss: She is not currently taking medications  for medical weight loss.   Previous pharmacotherapy for medical weight loss:  none  Bariatric surgery:  Has not had bariatric surgery.    PHYSICAL EXAM:  Blood pressure 131/86, pulse 81, temperature 97.7 F (36.5 C), height 5\' 2"  (1.575 m), weight 216 lb (98 kg), SpO2 99 %. Body mass index is 39.51 kg/m.  General: She is overweight, cooperative, alert, well developed, and in no acute distress. PSYCH: Has normal mood, affect and thought process.    Extremities: No edema.  Neurologic: No gross sensory or motor deficits. No tremors or fasciculations noted.    DIAGNOSTIC DATA REVIEWED:  BMET    Component Value Date/Time   NA 140 06/02/2021 0842   K 4.5 06/02/2021 0842   CL 106 06/02/2021 0842   CO2 25 06/02/2021 0842   GLUCOSE 85 06/02/2021 0842   BUN 11 06/02/2021 0842   CREATININE 0.77 06/02/2021 0842   CALCIUM 9.3 06/02/2021 0842   GFRNONAA >60 06/02/2021 0842   Lab Results  Component Value Date   HGBA1C 5.1 01/02/2023   Lab Results  Component Value Date   INSULIN 9.5 01/02/2023   No results found for: "TSH" CBC    Component Value Date/Time   WBC 7.3 06/02/2021 0842   RBC 5.07 06/02/2021 0842   HGB 14.7 06/02/2021 0842   HCT 45.8 06/02/2021 0842   PLT 280 06/02/2021 0842   MCV 90.3 06/02/2021 0842   MCH 29.0 06/02/2021 0842   MCHC 32.1 06/02/2021 0842   RDW 13.4 06/02/2021 0842   Iron Studies No results found for: "IRON", "TIBC", "FERRITIN", "IRONPCTSAT" Lipid Panel  No results found for: "CHOL", "TRIG", "HDL", "CHOLHDL", "VLDL", "LDLCALC", "LDLDIRECT" Hepatic Function Panel     Component Value Date/Time   PROT 7.7 06/02/2021 0842   ALBUMIN 3.7 06/02/2021 0842   AST 25 06/02/2021 0842   ALT 25 06/02/2021 0842   ALKPHOS 62 06/02/2021  1610   BILITOT 0.9 06/02/2021 0842   No results found for: "TSH" Nutritional Lab Results  Component Value Date   VD25OH 52.0 01/02/2023     ASSESSMENT AND PLAN  TREATMENT PLAN FOR OBESITY:  Recommended Dietary Goals  [name] is currently in the action stage of change. As such, her goal is to continue weight management plan. She has agreed to keeping a food journal and adhering to recommended goals of 1500-1600 calories and 90+ protein.  Macros looked better today especially protein intake.  Carbs are still high.  I would like to see how she does if she can decrease her carbs to 160-180. That is still high but she notes if she doesn't eat a certain amount of carbs  she will become hypoglycemic. She had a couple of days where her carbs were 161-184 and she didn't feel well.  I would like to see how she does with her carbs < 200.  I would like her to keep her protein > 90.  Her bio impedance looked better today.  Reviewed with patient.    Behavioral Intervention  We discussed the following Behavioral Modification Strategies today: increasing lean protein intake, decreasing simple carbohydrates , increasing vegetables, increasing lower glycemic fruits, increasing water intake, continue to practice mindfulness when eating, and planning for success.  Additional resources provided today: NA  Recommended Physical Activity Goals  [name] has been advised to work up to 150 minutes of moderate intensity aerobic activity a week and strengthening exercises 2-3 times per week for cardiovascular health, weight loss maintenance and preservation of muscle mass.   She has agreed to Continue current level of physical activity     ASSOCIATED CONDITIONS ADDRESSED TODAY  Action/Plan  Reactive hypoglycemia Will continue to monitor as she decreases her carbs intake.    Generalized obesity  BMI 39.0-39.9,adult         Return in about 4 weeks (around 03/11/2023).Marland Kitchen She was informed of the importance of frequent follow up visits to maximize her success with intensive lifestyle modifications for her multiple health conditions.   ATTESTASTION STATEMENTS:  Reviewed by clinician on day of visit: allergies, medications, problem list, medical history, surgical history, family history, social history, and previous encounter notes.   Time spent on visit including pre-visit chart review and post-visit care and charting was 20+ minutes.    Theodis Sato. Senna Lape FNP-C

## 2023-03-19 ENCOUNTER — Ambulatory Visit: Payer: BC Managed Care – PPO | Admitting: Nurse Practitioner

## 2023-03-19 ENCOUNTER — Encounter: Payer: Self-pay | Admitting: Nurse Practitioner

## 2023-03-19 VITALS — BP 128/82 | HR 72 | Temp 97.8°F | Ht 62.0 in | Wt 215.0 lb

## 2023-03-19 DIAGNOSIS — E669 Obesity, unspecified: Secondary | ICD-10-CM

## 2023-03-19 DIAGNOSIS — E161 Other hypoglycemia: Secondary | ICD-10-CM | POA: Diagnosis not present

## 2023-03-19 DIAGNOSIS — Z6839 Body mass index (BMI) 39.0-39.9, adult: Secondary | ICD-10-CM

## 2023-03-19 NOTE — Progress Notes (Signed)
Office: [phone]  /  Fax: [phone]  WEIGHT SUMMARY AND BIOMETRICS  Weight Lost Since Last Visit: 1lb  No data recorded  Vitals Temp: 97.8 F (36.6 C) BP: 128/82 Pulse Rate: 72 SpO2: 100 %   Anthropometric Measurements Height: 5\' 2"  (1.575 m) Weight: 215 lb (97.5 kg) BMI (Calculated): 39.31 Weight at Last Visit: 216lb Weight Lost Since Last Visit: 1lb Starting Weight: 214lb Total Weight Loss (lbs): 0 lb (0 kg)   Body Composition  Body Fat %: 43.9 % Fat Mass (lbs): 94.4 lbs Muscle Mass (lbs): 114.6 lbs Total Body Water (lbs): 90.6 lbs Visceral Fat Rating : 11   Other Clinical Data Fasting: No Labs: No Today's Visit #: 4 Starting Date: 01/02/23     HPI  Chief Complaint: OBESITY  Sara Bryant is here to discuss her progress with her obesity treatment plan. She is on the the Category 3 Plan and states she is following her eating plan approximately 100 % of the time. She states she is exercising 45-120 minutes 7 days per week-3 days resistance training and 4 days cardio (running and biking).   Interval History:  Since last office visit she has lost 1 pound.  She is averaging around 1400-1700 calories, 200+ carbs and 110-188 grams of protein.  She sometimes struggles with hunger.  She is drinking water, protein shake (days she does resistance training) and electrolytes.     Pharmacotherapy for weight loss: She is not currently taking medications  for medical weight loss.     Previous pharmacotherapy for medical weight loss:  none  Bariatric surgery:  Patient has not had bariatric surgery.    PHYSICAL EXAM:  Blood pressure 128/82, pulse 72, temperature 97.8 F (36.6 C), height 5\' 2"  (1.575 m), weight 215 lb (97.5 kg), SpO2 100 %. Body mass index is 39.32 kg/m.  General: She is overweight, cooperative, alert, well developed, and in no acute distress. PSYCH: Has normal mood, affect and thought process.   Extremities: No edema.  Neurologic: No gross  sensory or motor deficits. No tremors or fasciculations noted.    DIAGNOSTIC DATA REVIEWED:  BMET    Component Value Date/Time   NA 140 06/02/2021 0842   K 4.5 06/02/2021 0842   CL 106 06/02/2021 0842   CO2 25 06/02/2021 0842   GLUCOSE 85 06/02/2021 0842   BUN 11 06/02/2021 0842   CREATININE 0.77 06/02/2021 0842   CALCIUM 9.3 06/02/2021 0842   GFRNONAA >60 06/02/2021 0842   Lab Results  Component Value Date   HGBA1C 5.1 01/02/2023   Lab Results  Component Value Date   INSULIN 9.5 01/02/2023   No results found for: "TSH" CBC    Component Value Date/Time   WBC 7.3 06/02/2021 0842   RBC 5.07 06/02/2021 0842   HGB 14.7 06/02/2021 0842   HCT 45.8 06/02/2021 0842   PLT 280 06/02/2021 0842   MCV 90.3 06/02/2021 0842   MCH 29.0 06/02/2021 0842   MCHC 32.1 06/02/2021 0842   RDW 13.4 06/02/2021 0842   Iron Studies No results found for: "IRON", "TIBC", "FERRITIN", "IRONPCTSAT" Lipid Panel  No results found for: "CHOL", "TRIG", "HDL", "CHOLHDL", "VLDL", "LDLCALC", "LDLDIRECT" Hepatic Function Panel     Component Value Date/Time   PROT 7.7 06/02/2021 0842   ALBUMIN 3.7 06/02/2021 0842   AST 25 06/02/2021 0842   ALT 25 06/02/2021 0842   ALKPHOS 62 06/02/2021 0842   BILITOT 0.9 06/02/2021 0842   No results found for: "TSH" Nutritional Lab Results  Component Value Date   VD25OH 52.0 01/02/2023     ASSESSMENT AND PLAN  TREATMENT PLAN FOR OBESITY:  Recommended Dietary Goals  Sara Bryant is currently in the action stage of change. As such, her goal is to continue weight management plan. She has agreed to keeping a food journal and adhering to recommended goals of 1700 calories and 100+ protein.  Behavioral Intervention  We discussed the following Behavioral Modification Strategies today: increasing lean protein intake, decreasing simple carbohydrates , increasing vegetables, increasing lower glycemic fruits, increasing water intake, continue to practice mindfulness when  eating, and planning for success.  Additional resources provided today: NA  Recommended Physical Activity Goals  Sara Bryant has been advised to work up to 150 minutes of moderate intensity aerobic activity a week and strengthening exercises 2-3 times per week for cardiovascular health, weight loss maintenance and preservation of muscle mass.   She has agreed to Continue current level of physical activity      ASSOCIATED CONDITIONS ADDRESSED TODAY  Action/Plan  Reactive hypoglycemia She is still high on carbs intake due to her history of hypoglycemia.  I will have her decrease her carbs intake by 10-20 grams and see how she does.  She is doing well on protein and calories intake but carbs are still too high for weight loss.  I would also like her to increase her protein intake on resistance training days and see how she does.    Generalized obesity  BMI 39.0-39.9,adult      Labs reviewed with patient from 11/07/22 and scanned into mychart under media.     Return in about 4 weeks (around 04/16/2023).Marland Kitchen She was informed of the importance of frequent follow up visits to maximize her success with intensive lifestyle modifications for her multiple health conditions.   ATTESTASTION STATEMENTS:  Reviewed by clinician on day of visit: allergies, medications, problem list, medical history, surgical history, family history, social history, and previous encounter notes.   Time spent on visit including pre-visit chart review and post-visit care and charting was 30 minutes.    Theodis Sato. Camisha Srey FNP-C

## 2023-04-24 ENCOUNTER — Ambulatory Visit: Payer: BC Managed Care – PPO | Admitting: Nurse Practitioner

## 2023-05-14 DIAGNOSIS — J4520 Mild intermittent asthma, uncomplicated: Secondary | ICD-10-CM | POA: Diagnosis not present

## 2023-05-28 ENCOUNTER — Ambulatory Visit: Payer: BC Managed Care – PPO | Admitting: Nurse Practitioner

## 2023-05-28 ENCOUNTER — Encounter: Payer: Self-pay | Admitting: Nurse Practitioner

## 2023-05-28 VITALS — BP 135/81 | HR 85 | Temp 97.9°F | Ht 62.0 in | Wt 212.0 lb

## 2023-05-28 DIAGNOSIS — Z6838 Body mass index (BMI) 38.0-38.9, adult: Secondary | ICD-10-CM | POA: Diagnosis not present

## 2023-05-28 DIAGNOSIS — E669 Obesity, unspecified: Secondary | ICD-10-CM | POA: Diagnosis not present

## 2023-05-28 DIAGNOSIS — J45909 Unspecified asthma, uncomplicated: Secondary | ICD-10-CM

## 2023-05-28 NOTE — Progress Notes (Signed)
Office: [phone]  /  Fax: [phone]  WEIGHT SUMMARY AND BIOMETRICS  Weight Lost Since Last Visit: 3lb  Weight Gained Since Last Visit: 0lb   Vitals Temp: 97.9 F (36.6 C) BP: 135/81 Pulse Rate: 85 SpO2: 99 %   Anthropometric Measurements Height: 5\' 2"  (1.575 m) Weight: 212 lb (96.2 kg) BMI (Calculated): 38.77 Weight at Last Visit: 215lb Weight Lost Since Last Visit: 3lb Weight Gained Since Last Visit: 0lb Starting Weight: 214lb Total Weight Loss (lbs): 2 lb (0.907 kg)   Body Composition  Body Fat %: 43.9 % Fat Mass (lbs): 93.2 lbs Muscle Mass (lbs): 113.2 lbs Total Body Water (lbs): 79.8 lbs Visceral Fat Rating : 11   Other Clinical Data Fasting: Yes Labs: No Today's Visit #: 5 Starting Date: 01/02/23     HPI  Chief Complaint: OBESITY  Jaeci is here to discuss her progress with her obesity treatment plan. She is on the the Category 3 Plan and states she is following her eating plan approximately 100 % of the time. She states she is exercising 45-120 minutes 7 days per week.   Interval History:  Since last office visit she has lost 3 pounds.  She has been trying to eat more protein. She is using Wonderslim 2 days per week as a meal replacement.  She hurt her left knee and is on prednisone for an asthma exacerbation.   She hasn't been able to exercise as she was but plans to start back when she is overall feeling better.  She is drinking water and a protein water.    Using mynetdiary: Calories  1600-1700 Protein 100-125 grams Carbs   211 grams Fats  44    Pharmacotherapy for weight loss: She is not currently taking medications  for medical weight loss.    Previous pharmacotherapy for medical weight loss:  None  Bariatric surgery:  Has not had bariatric surgery.    PHYSICAL EXAM:  Blood pressure 135/81, pulse 85, temperature 97.9 F (36.6 C), height 5\' 2"  (1.575 m), weight 212 lb (96.2 kg), SpO2 99%. Body mass index is 38.78  kg/m.  General: She is overweight, cooperative, alert, well developed, and in no acute distress. PSYCH: Has normal mood, affect and thought process.   Extremities: No edema.  Neurologic: No gross sensory or motor deficits. No tremors or fasciculations noted.    DIAGNOSTIC DATA REVIEWED:  BMET    Component Value Date/Time   NA 140 06/02/2021 0842   K 4.5 06/02/2021 0842   CL 106 06/02/2021 0842   CO2 25 06/02/2021 0842   GLUCOSE 85 06/02/2021 0842   BUN 11 06/02/2021 0842   CREATININE 0.77 06/02/2021 0842   CALCIUM 9.3 06/02/2021 0842   GFRNONAA >60 06/02/2021 0842   Lab Results  Component Value Date   HGBA1C 5.1 01/02/2023   Lab Results  Component Value Date   INSULIN 9.5 01/02/2023   No results found for: "TSH" CBC    Component Value Date/Time   WBC 7.3 06/02/2021 0842   RBC 5.07 06/02/2021 0842   HGB 14.7 06/02/2021 0842   HCT 45.8 06/02/2021 0842   PLT 280 06/02/2021 0842   MCV 90.3 06/02/2021 0842   MCH 29.0 06/02/2021 0842   MCHC 32.1 06/02/2021 0842   RDW 13.4 06/02/2021 0842   Iron Studies No results found for: "IRON", "TIBC", "FERRITIN", "IRONPCTSAT" Lipid Panel  No results found for: "CHOL", "TRIG", "HDL", "CHOLHDL", "VLDL", "LDLCALC", "LDLDIRECT" Hepatic Function Panel     Component Value Date/Time  PROT 7.7 06/02/2021 0842   ALBUMIN 3.7 06/02/2021 0842   AST 25 06/02/2021 0842   ALT 25 06/02/2021 0842   ALKPHOS 62 06/02/2021 0842   BILITOT 0.9 06/02/2021 0842   No results found for: "TSH" Nutritional Lab Results  Component Value Date   VD25OH 52.0 01/02/2023     ASSESSMENT AND PLAN  TREATMENT PLAN FOR OBESITY:  Recommended Dietary Goals  Timaya is currently in the action stage of change. As such, her goal is to continue weight management plan. She has agreed to the Category 3 Plan.  She is still high on carbs intake.  I would like her to try decreasing her carbs intake by 10 to 20 grams and see how she does.  Once she is able to  decrease her carbs intake I think she will see more weight loss.  Behavioral Intervention  We discussed the following Behavioral Modification Strategies today: increasing lean protein intake, decreasing simple carbohydrates , increasing vegetables, increasing lower glycemic fruits, increasing water intake, work on meal planning and preparation, work on tracking and journaling calories using tracking application, continue to practice mindfulness when eating, and planning for success.  Additional resources provided today: NA  Recommended Physical Activity Goals  Jackson has been advised to work up to 150 minutes of moderate intensity aerobic activity a week and strengthening exercises 2-3 times per week for cardiovascular health, weight loss maintenance and preservation of muscle mass.   She has agreed to Continue current level of physical activity    ASSOCIATED CONDITIONS ADDRESSED TODAY  Action/Plan  Uncomplicated asthma, unspecified asthma severity, unspecified whether persistent Continue prednisone as directed. Continue Singulair.  Continue to follow up with PCP.    Generalized obesity  BMI 38.0-38.9,adult      Will check cortisol in November     Return in about 4 weeks (around 06/25/2023).Marland Kitchen She was informed of the importance of frequent follow up visits to maximize her success with intensive lifestyle modifications for her multiple health conditions.   ATTESTASTION STATEMENTS:  Reviewed by clinician on day of visit: allergies, medications, problem list, medical history, surgical history, family history, social history, and previous encounter notes.   Time spent on visit including pre-visit chart review and post-visit care and charting was 30 minutes.    Theodis Sato. Tishia Maestre FNP-C

## 2023-06-26 ENCOUNTER — Ambulatory Visit: Payer: BC Managed Care – PPO | Admitting: Nurse Practitioner

## 2023-06-26 ENCOUNTER — Encounter: Payer: Self-pay | Admitting: Nurse Practitioner

## 2023-06-26 VITALS — BP 133/83 | HR 81 | Temp 97.9°F | Ht 62.0 in | Wt 221.0 lb

## 2023-06-26 DIAGNOSIS — J45909 Unspecified asthma, uncomplicated: Secondary | ICD-10-CM | POA: Diagnosis not present

## 2023-06-26 DIAGNOSIS — Z6841 Body Mass Index (BMI) 40.0 and over, adult: Secondary | ICD-10-CM | POA: Diagnosis not present

## 2023-06-26 DIAGNOSIS — E669 Obesity, unspecified: Secondary | ICD-10-CM

## 2023-06-26 NOTE — Progress Notes (Signed)
Office: [phone]  /  Fax: [phone]  WEIGHT SUMMARY AND BIOMETRICS  Weight Lost Since Last Visit: 0lb  Weight Gained Since Last Visit: 9lb   Vitals Temp: 97.9 F (36.6 C) BP: 133/83 Pulse Rate: 81 SpO2: 97 %   Anthropometric Measurements Height: 5\' 2"  (1.575 m) Weight: 221 lb (100.2 kg) BMI (Calculated): 40.41 Weight at Last Visit: 212lb Weight Lost Since Last Visit: 0lb Weight Gained Since Last Visit: 9lb Starting Weight: 214lb Total Weight Loss (lbs): 0 lb (0 kg)   Body Composition  Body Fat %: 43.2 % Fat Mass (lbs): 95.8 lbs Muscle Mass (lbs): 119.4 lbs Total Body Water (lbs): 85 lbs Visceral Fat Rating : 12   Other Clinical Data Fasting: Yes Labs: No Today's Visit #: 6 Starting Date: 01/02/23     HPI  Chief Complaint: OBESITY  Sara Bryant is here to discuss her progress with her obesity treatment plan. She is on the the Category 3 Plan and states she is following her eating plan approximately 100 % of the time. She states she is exercising 90 minutes 7 days per week.   Interval History:  Since last office visit she has gained 9 pounds.  She is aiming to eat more protein.  She was on  prednisone for 12 days for asthma exacerbation.  She struggled with side effects and is starting to feel better.  She hasn't been able to exercise as she was due to side effects.  She denies wheezing or SHOB.  She is increasing her exercise little by little.  She continues to work on upper body/core.  She is drinking water daily.      mynetdiary:  Calories:  1500-1600 Carbs:  150-207 Protein:  80-110  Water weight up 6 lbs.    Pharmacotherapy for weight loss: She is not currently taking medications  for medical weight loss.  Previous pharmacotherapy for medical weight loss:  None  Bariatric surgery:  Patient has not had bariatric surgery    PHYSICAL EXAM:  Blood pressure 133/83, pulse 81, temperature 97.9 F (36.6 C), height 5\' 2"  (1.575 m), weight 221 lb  (100.2 kg), SpO2 97%. Body mass index is 40.42 kg/m.  General: She is overweight, cooperative, alert, well developed, and in no acute distress. PSYCH: Has normal mood, affect and thought process.   Extremities: No edema.  Neurologic: No gross sensory or motor deficits. No tremors or fasciculations noted.    DIAGNOSTIC DATA REVIEWED:  BMET    Component Value Date/Time   NA 140 06/02/2021 0842   K 4.5 06/02/2021 0842   CL 106 06/02/2021 0842   CO2 25 06/02/2021 0842   GLUCOSE 85 06/02/2021 0842   BUN 11 06/02/2021 0842   CREATININE 0.77 06/02/2021 0842   CALCIUM 9.3 06/02/2021 0842   GFRNONAA >60 06/02/2021 0842   Lab Results  Component Value Date   HGBA1C 5.1 01/02/2023   Lab Results  Component Value Date   INSULIN 9.5 01/02/2023   No results found for: "TSH" CBC    Component Value Date/Time   WBC 7.3 06/02/2021 0842   RBC 5.07 06/02/2021 0842   HGB 14.7 06/02/2021 0842   HCT 45.8 06/02/2021 0842   PLT 280 06/02/2021 0842   MCV 90.3 06/02/2021 0842   MCH 29.0 06/02/2021 0842   MCHC 32.1 06/02/2021 0842   RDW 13.4 06/02/2021 0842   Iron Studies No results found for: "IRON", "TIBC", "FERRITIN", "IRONPCTSAT" Lipid Panel  No results found for: "CHOL", "TRIG", "HDL", "CHOLHDL", "VLDL", "LDLCALC", "  LDLDIRECT" Hepatic Function Panel     Component Value Date/Time   PROT 7.7 06/02/2021 0842   ALBUMIN 3.7 06/02/2021 0842   AST 25 06/02/2021 0842   ALT 25 06/02/2021 0842   ALKPHOS 62 06/02/2021 0842   BILITOT 0.9 06/02/2021 0842   No results found for: "TSH" Nutritional Lab Results  Component Value Date   VD25OH 52.0 01/02/2023     ASSESSMENT AND PLAN  TREATMENT PLAN FOR OBESITY:  Recommended Dietary Goals  Sara Bryant is currently in the action stage of change. As such, her goal is to continue weight management plan. She has agreed to the Category 3 Plan.  Behavioral Intervention  We discussed the following Behavioral Modification Strategies today:  increasing lean protein intake, decreasing simple carbohydrates , increasing vegetables, increasing lower glycemic fruits, increasing water intake, work on tracking and journaling calories using tracking application, continue to practice mindfulness when eating, and planning for success.  Additional resources provided today: NA  Recommended Physical Activity Goals  Sara Bryant has been advised to work up to 150 minutes of moderate intensity aerobic activity a week and strengthening exercises 2-3 times per week for cardiovascular health, weight loss maintenance and preservation of muscle mass.   She has agreed to Continue current level of physical activity    ASSOCIATED CONDITIONS ADDRESSED TODAY  Action/Plan  Uncomplicated asthma, unspecified asthma severity, unspecified whether persistent Doing better.  Will continue to monitor.  To PCP with any worsening of symptoms  Generalized obesity  BMI 40.0-44.9, adult (HCC)         Return in about 4 weeks (around 07/24/2023).Marland Kitchen She was informed of the importance of frequent follow up visits to maximize her success with intensive lifestyle modifications for her multiple health conditions.   ATTESTASTION STATEMENTS:  Reviewed by clinician on day of visit: allergies, medications, problem list, medical history, surgical history, family history, social history, and previous encounter notes.   Time spent on visit including pre-visit chart review and post-visit care and charting was 30 minutes.    Theodis Sato. Lionell Matuszak FNP-C

## 2023-07-24 ENCOUNTER — Encounter: Payer: Self-pay | Admitting: Nurse Practitioner

## 2023-07-24 ENCOUNTER — Ambulatory Visit: Payer: BC Managed Care – PPO | Admitting: Nurse Practitioner

## 2023-07-24 VITALS — HR 80 | Temp 97.9°F | Ht 62.0 in | Wt 220.0 lb

## 2023-07-24 DIAGNOSIS — E669 Obesity, unspecified: Secondary | ICD-10-CM | POA: Diagnosis not present

## 2023-07-24 DIAGNOSIS — J45909 Unspecified asthma, uncomplicated: Secondary | ICD-10-CM | POA: Diagnosis not present

## 2023-07-24 DIAGNOSIS — Z6841 Body Mass Index (BMI) 40.0 and over, adult: Secondary | ICD-10-CM

## 2023-07-24 NOTE — Progress Notes (Signed)
Office: [phone]  /  Fax: [phone]  WEIGHT SUMMARY AND BIOMETRICS  Weight Lost Since Last Visit: 1lb  Weight Gained Since Last Visit: 0lb   Vitals Temp: 97.9 F (36.6 C) BP: -- (Refused) Pulse Rate: 80 SpO2: 100 %   Anthropometric Measurements Height: 5\' 2"  (1.575 m) Weight: 220 lb (99.8 kg) BMI (Calculated): 40.23 Weight at Last Visit: 221lb Weight Lost Since Last Visit: 1lb Weight Gained Since Last Visit: 0lb Starting Weight: 214lb Total Weight Loss (lbs): 0 lb (0 kg)   Body Composition  Body Fat %: 46.1 % Fat Mass (lbs): 101.6 lbs Muscle Mass (lbs): 112.8 lbs Total Body Water (lbs): 83.2 lbs Visceral Fat Rating : 12   Other Clinical Data Fasting: Yes Labs: No Today's Visit #: 7 Starting Date: 01/02/23     HPI  Chief Complaint: OBESITY  Sara Bryant is here to discuss her progress with her obesity treatment plan. She is on the the Category 3 Plan and states she is following her eating plan approximately 100 % of the time. She states she is exercising 90-120 minutes 7 days per week-1 hour treadmill on cardio days 4 days and 3 days resistance training.    Interval History:  Since last office visit she has lost 1 pound.  Felt like she was able to get back in a routine over the past 1-2 weeks since her last asthma exacerbation.  She is drinking water and 1 soda daily.   Denies polyphagia or cravings.    Calories:  1400-1700 Carbs:  77-245 Protein:   >120  Pharmacotherapy for weight loss: She is not currently taking medications  for medical weight loss.    Previous pharmacotherapy for medical weight loss:  None  Bariatric surgery:  Patient has not had bariatric surgery   Asthma Doing better.  Taking Zyrtec and Singulair 10mg . Denies wheezing or SHOB.    PHYSICAL EXAM:  Pulse 80, temperature 97.9 F (36.6 C), height 5\' 2"  (1.575 m), weight 220 lb (99.8 kg), SpO2 100%. Body mass index is 40.24 kg/m.  General: She is overweight, cooperative,  alert, well developed, and in no acute distress. PSYCH: Has normal mood, affect and thought process.   Extremities: No edema.  Neurologic: No gross sensory or motor deficits. No tremors or fasciculations noted.    DIAGNOSTIC DATA REVIEWED:  BMET    Component Value Date/Time   NA 140 06/02/2021 0842   K 4.5 06/02/2021 0842   CL 106 06/02/2021 0842   CO2 25 06/02/2021 0842   GLUCOSE 85 06/02/2021 0842   BUN 11 06/02/2021 0842   CREATININE 0.77 06/02/2021 0842   CALCIUM 9.3 06/02/2021 0842   GFRNONAA >60 06/02/2021 0842   Lab Results  Component Value Date   HGBA1C 5.1 01/02/2023   Lab Results  Component Value Date   INSULIN 9.5 01/02/2023   No results found for: "TSH" CBC    Component Value Date/Time   WBC 7.3 06/02/2021 0842   RBC 5.07 06/02/2021 0842   HGB 14.7 06/02/2021 0842   HCT 45.8 06/02/2021 0842   PLT 280 06/02/2021 0842   MCV 90.3 06/02/2021 0842   MCH 29.0 06/02/2021 0842   MCHC 32.1 06/02/2021 0842   RDW 13.4 06/02/2021 0842   Iron Studies No results found for: "IRON", "TIBC", "FERRITIN", "IRONPCTSAT" Lipid Panel  No results found for: "CHOL", "TRIG", "HDL", "CHOLHDL", "VLDL", "LDLCALC", "LDLDIRECT" Hepatic Function Panel     Component Value Date/Time   PROT 7.7 06/02/2021 0842   ALBUMIN 3.7  06/02/2021 0842   AST 25 06/02/2021 0842   ALT 25 06/02/2021 0842   ALKPHOS 62 06/02/2021 0842   BILITOT 0.9 06/02/2021 0842   No results found for: "TSH" Nutritional Lab Results  Component Value Date   VD25OH 52.0 01/02/2023     ASSESSMENT AND PLAN  TREATMENT PLAN FOR OBESITY:  Recommended Dietary Goals  Camill is currently in the action stage of change. As such, her goal is to continue weight management plan. She has agreed to keeping a food journal and adhering to recommended goals of 1500 calories and >100 grams protein.  Behavioral Intervention  We discussed the following Behavioral Modification Strategies today: work on tracking and  journaling calories using tracking application and continue to work on maintaining a reduced calorie state, getting the recommended amount of protein, incorporating whole foods, making healthy choices, staying well hydrated and practicing mindfulness when eating..  Additional resources provided today: NA  Recommended Physical Activity Goals  Nesha has been advised to work up to 150 minutes of moderate intensity aerobic activity a week and strengthening exercises 2-3 times per week for cardiovascular health, weight loss maintenance and preservation of muscle mass.   She has agreed to Continue current level of physical activity     ASSOCIATED CONDITIONS ADDRESSED TODAY  Action/Plan  Uncomplicated asthma, unspecified asthma severity, unspecified whether persistent Doing better Continue meds as directed  Generalized obesity  BMI 40.0-44.9, adult (HCC)         Return in about 4 weeks (around 08/21/2023).Marland Kitchen She was informed of the importance of frequent follow up visits to maximize her success with intensive lifestyle modifications for her multiple health conditions.   ATTESTASTION STATEMENTS:  Reviewed by clinician on day of visit: allergies, medications, problem list, medical history, surgical history, family history, social history, and previous encounter notes.   Time spent on visit including pre-visit chart review and post-visit care and charting was 30 minutes.    Theodis Sato. Rahil Passey FNP-C

## 2023-08-27 ENCOUNTER — Ambulatory Visit: Payer: BC Managed Care – PPO | Admitting: Nurse Practitioner

## 2023-08-27 ENCOUNTER — Encounter: Payer: Self-pay | Admitting: Nurse Practitioner

## 2023-08-27 VITALS — BP 118/72 | HR 79 | Temp 98.0°F | Ht 62.0 in | Wt 222.0 lb

## 2023-08-27 DIAGNOSIS — R7989 Other specified abnormal findings of blood chemistry: Secondary | ICD-10-CM

## 2023-08-27 DIAGNOSIS — Z6841 Body Mass Index (BMI) 40.0 and over, adult: Secondary | ICD-10-CM

## 2023-08-27 NOTE — Progress Notes (Signed)
Office: [phone]  /  Fax: [phone]  WEIGHT SUMMARY AND BIOMETRICS  Weight Lost Since Last Visit: 0lb  Weight Gained Since Last Visit: 2lb   Vitals Temp: 98 F (36.7 C) BP: 118/72 Pulse Rate: 79 SpO2: 98 %   Anthropometric Measurements Height: 5\' 2"  (1.575 m) Weight: 222 lb (100.7 kg) BMI (Calculated): 40.59 Weight at Last Visit: 220lb Weight Lost Since Last Visit: 0lb Weight Gained Since Last Visit: 2lb Starting Weight: 214lb Total Weight Loss (lbs): 0 lb (0 kg)   Body Composition  Body Fat %: 46.1 % Fat Mass (lbs): 102.8 lbs Muscle Mass (lbs): 114 lbs Total Body Water (lbs): 83 lbs Visceral Fat Rating : 12   Other Clinical Data Fasting: Yes Labs: No Today's Visit #: 8 Starting Date: 01/02/23     HPI  Chief Complaint: OBESITY  Sara Bryant is here to discuss her progress with her obesity treatment plan. She is on the the Category 3 Plan and states she is following her eating plan approximately 100 % of the time. She states she is exercising 60 minutes 7 days per week.   Interval History:  Since last office visit she has gained 2 pounds. She tried to decrease her carbs since her last visit and felt that her blood sugars dropped and she didn't do well.   She feels that she did well over Thanksgiving and will do well during Christmas. She is back to running 3 miles 5-6 days per week.  She is doing resistance training 1-2 days per week.  She is drinking water, 1 soda and a protein shake.    Calories:  1400-1700 Carbs:  100-150 Protein:   >120  She is weighing and measuring her food.   Pharmacotherapy for weight loss: She is not currently taking medications  for medical weight loss.     Previous pharmacotherapy for medical weight loss:  None   Bariatric surgery:  Patient has not had bariatric surgery    PHYSICAL EXAM:  Blood pressure 118/72, pulse 79, temperature 98 F (36.7 C), height 5\' 2"  (1.575 m), weight 222 lb (100.7 kg), SpO2 98%. Body  mass index is 40.6 kg/m.  General: She is overweight, cooperative, alert, well developed, and in no acute distress. PSYCH: Has normal mood, affect and thought process.   Extremities: No edema.  Neurologic: No gross sensory or motor deficits. No tremors or fasciculations noted.    DIAGNOSTIC DATA REVIEWED:  BMET    Component Value Date/Time   NA 140 06/02/2021 0842   K 4.5 06/02/2021 0842   CL 106 06/02/2021 0842   CO2 25 06/02/2021 0842   GLUCOSE 85 06/02/2021 0842   BUN 11 06/02/2021 0842   CREATININE 0.77 06/02/2021 0842   CALCIUM 9.3 06/02/2021 0842   GFRNONAA >60 06/02/2021 0842   Lab Results  Component Value Date   HGBA1C 5.1 01/02/2023   Lab Results  Component Value Date   INSULIN 9.5 01/02/2023   No results found for: "TSH" CBC    Component Value Date/Time   WBC 7.3 06/02/2021 0842   RBC 5.07 06/02/2021 0842   HGB 14.7 06/02/2021 0842   HCT 45.8 06/02/2021 0842   PLT 280 06/02/2021 0842   MCV 90.3 06/02/2021 0842   MCH 29.0 06/02/2021 0842   MCHC 32.1 06/02/2021 0842   RDW 13.4 06/02/2021 0842   Iron Studies No results found for: "IRON", "TIBC", "FERRITIN", "IRONPCTSAT" Lipid Panel  No results found for: "CHOL", "TRIG", "HDL", "CHOLHDL", "VLDL", "LDLCALC", "LDLDIRECT" Hepatic Function  Panel     Component Value Date/Time   PROT 7.7 06/02/2021 0842   ALBUMIN 3.7 06/02/2021 0842   AST 25 06/02/2021 0842   ALT 25 06/02/2021 0842   ALKPHOS 62 06/02/2021 0842   BILITOT 0.9 06/02/2021 0842   No results found for: "TSH" Nutritional Lab Results  Component Value Date   VD25OH 52.0 01/02/2023     ASSESSMENT AND PLAN  TREATMENT PLAN FOR OBESITY:  Recommended Dietary Goals  Sara Bryant is currently in the action stage of change. As such, her goal is to continue weight management plan. She has agreed to the Category 3 Plan.  Patient is still very high in carb intake.  I've discussed decreasing but she states she can't because her blood sugar drops to low.  I've asked her to monitor her blood sugars at home and can review at her next visit.  She is overall happy with how she is doing.    Behavioral Intervention  We discussed the following Behavioral Modification Strategies today: increasing lean protein intake to established goals, increasing water intake , keeping healthy foods at home, planning for success, and continue to work on maintaining a reduced calorie state, getting the recommended amount of protein, incorporating whole foods, making healthy choices, staying well hydrated and practicing mindfulness when eating..  Additional resources provided today: NA  Recommended Physical Activity Goals  Sara Bryant has been advised to work up to 150 minutes of moderate intensity aerobic activity a week and strengthening exercises 2-3 times per week for cardiovascular health, weight loss maintenance and preservation of muscle mass.   She has agreed to Continue current level of physical activity    Pharmacotherapy We discussed various medication options to help Sara Bryant with her weight loss efforts and she is not interested in starting a medication at this time.    ASSOCIATED CONDITIONS ADDRESSED TODAY  Action/Plan  Elevated morning serum cortisol level Discussed rechecking today or referring to endo for further evaluation.  She would like to hold off on rechecking due to taking prednisone last in September.  She can recheck at this time but will wait per patient request.  She is seeing her PCP in February and will discuss obtaining during that visit.   Morbid obesity (HCC)  BMI 40.0-44.9, adult (HCC)         Return in about 4 weeks (around 09/24/2023).Marland Kitchen She was informed of the importance of frequent follow up visits to maximize her success with intensive lifestyle modifications for her multiple health conditions.   ATTESTASTION STATEMENTS:  Reviewed by clinician on day of visit: allergies, medications, problem list, medical history, surgical  history, family history, social history, and previous encounter notes.   Time spent on visit including pre-visit chart review and post-visit care and charting was 30 minutes.    Sara Bryant. Sara Eddington FNP-C

## 2023-10-03 ENCOUNTER — Encounter: Payer: Self-pay | Admitting: Family Medicine

## 2023-10-03 ENCOUNTER — Ambulatory Visit: Payer: BC Managed Care – PPO | Admitting: Family Medicine

## 2023-10-03 VITALS — BP 123/85 | HR 101 | Temp 98.6°F | Ht 62.0 in | Wt 223.0 lb

## 2023-10-03 DIAGNOSIS — E66813 Obesity, class 3: Secondary | ICD-10-CM

## 2023-10-03 DIAGNOSIS — Z636 Dependent relative needing care at home: Secondary | ICD-10-CM | POA: Diagnosis not present

## 2023-10-03 DIAGNOSIS — Z6841 Body Mass Index (BMI) 40.0 and over, adult: Secondary | ICD-10-CM | POA: Diagnosis not present

## 2023-10-03 DIAGNOSIS — R7989 Other specified abnormal findings of blood chemistry: Secondary | ICD-10-CM | POA: Diagnosis not present

## 2023-10-03 NOTE — Assessment & Plan Note (Signed)
 High stress levels around caregiving for her aging mom has been a likely factor in her lack of weight loss.  Her weight has been fairly stable for ~7 years though she is down a size due to changes in body composition from workout out (great!).  We discussed how high stress levels can contribute to weight gain and hair thinning.  Consider CBT with Dr Sharron Continue to use exercise for stress reduction Great job improving sleep at night

## 2023-10-03 NOTE — Assessment & Plan Note (Signed)
 She has never had issues with high glucose levels and her last round of steroids finished in early Sept She denies a previous hx of Cushing's Dz  Repeat AM cortisol next visit Refer to endo if elevated

## 2023-10-03 NOTE — Progress Notes (Signed)
 Office: [phone]  /  Fax: [phone]  WEIGHT SUMMARY AND BIOMETRICS  Starting Date: 01/02/23  Starting Weight: 214lb   Weight Lost Since Last Visit: 0lb   Vitals Temp: 98.6 F (37 C) BP: 123/85 Pulse Rate: (!) 101 SpO2: 100 %   Body Composition  Body Fat %: 46.3 % Fat Mass (lbs): 103.6 lbs Muscle Mass (lbs): 114 lbs Total Body Water (lbs): 83.2 lbs Visceral Fat Rating : 13    HPI  Chief Complaint: OBESITY  Sara Bryant is here to discuss her progress with her obesity treatment plan. She is on the the Category 3 Plan and states she is following her eating plan approximately 90 % of the time. She states she is exercising 60-120 minutes 7 times per week.  Interval History:  Since last office visit she is up 1 lb She has been doing cardio and weights at the gym for a combination of 6-7 days/ wk Her last round of prednisone  was in Sept for asthma She has been able to increase endurance and weight training She is tracking her calories and aim for 1600 cal/ day She tracks her workouts separately She is getting ~120 g of protein in daily She is not a big snacker, eating 3 meals per day She has a net weight gain of 9 lb in the past 8 mos of medically supervised weight management Stress levels are high at home, caring for her mom who has demential and personality disorder She has a sedentary job in IT She is sleeping better, ~ 7 hrs at night  Pharmacotherapy: none  PHYSICAL EXAM:  Blood pressure 123/85, pulse (!) 101, temperature 98.6 F (37 C), height 5' 2 (1.575 m), weight 223 lb (101.2 kg), SpO2 100%. Body mass index is 40.79 kg/m.  General: She is overweight, cooperative, alert, well developed, and in no acute distress. PSYCH: Has normal mood, affect and thought process.   Lungs: Normal breathing effort, no conversational dyspnea. Female pattern baldness  ASSESSMENT AND PLAN  TREATMENT PLAN FOR OBESITY:  Recommended Dietary Goals  Sara Bryant is currently in  the action stage of change. As such, her goal is to continue weight management plan. She has agreed to keeping a food journal and adhering to recommended goals of 1800-2200 calories and 120 g of  protein. Increased her recommended kcal intake based on her moderate intensity workouts 6 days/ wk (BMR x 1.55) and created her new calorie deficit based on this On non workout days, she can keep kcal at 1800 and on workout days, up to 2200 kcal/ day  Behavioral Intervention  We discussed the following Behavioral Modification Strategies today: increasing lean protein intake to established goals, avoiding skipping meals, increasing water intake , work on tracking and journaling calories using tracking application, reading food labels , identifying sources and decreasing liquid calories, avoiding temptations and identifying enticing environmental cues, continue to practice mindfulness when eating, planning for success, and continue to work on maintaining a reduced calorie state, getting the recommended amount of protein, incorporating whole foods, making healthy choices, staying well hydrated and practicing mindfulness when eating.. In review of her food intake, she is drinking [employer] smoothies in the morning These are higher in sugar than recommended  Additional resources provided today: NA  Recommended Physical Activity Goals  Sara Bryant has been advised to work up to 150 minutes of moderate intensity aerobic activity a week and strengthening exercises 2-3 times per week for cardiovascular health, weight loss maintenance and preservation of muscle mass.  She has agreed to Work on scheduling and tracking physical activity.   Pharmacotherapy changes for the treatment of obesity: none  ASSOCIATED CONDITIONS ADDRESSED TODAY  Caregiver stress Assessment & Plan: High stress levels around caregiving for her aging mom has been a likely factor in her lack of weight loss.  Her weight has been fairly  stable for ~7 years though she is down a size due to changes in body composition from workout out (great!).  We discussed how high stress levels can contribute to weight gain and hair thinning.  Consider CBT with Dr Sharron Continue to use exercise for stress reduction Great job improving sleep at night   Class 3 severe obesity due to excess calories with body mass index (BMI) of 40.0 to 44.9 in adult, unspecified whether serious comorbidity present (HCC)  Elevated morning serum cortisol level Assessment & Plan: She has never had issues with high glucose levels and her last round of steroids finished in early Sept She denies a previous hx of Cushing's Dz  Repeat AM cortisol next visit Refer to endo if elevated       She was informed of the importance of frequent follow up visits to maximize her success with intensive lifestyle modifications for her multiple health conditions.   ATTESTASTION STATEMENTS:  Reviewed by clinician on day of visit: allergies, medications, problem list, medical history, surgical history, family history, social history, and previous encounter notes pertinent to obesity diagnosis.   I have personally spent 30 minutes total time today in preparation, patient care, nutritional counseling and documentation for this visit, including the following: review of clinical lab tests; review of medical tests/procedures/services.      Sara FORBES Haddock, DO DABFM, DABOM [facility] [address] [place], [place] [address] [phone]

## 2023-10-03 NOTE — Patient Instructions (Signed)
 Keep up the good work with workouts!!  Try increasing calorie intake to 2200 per day This should include a good 120 g of protein daily (On non workout days, you can move your calorie target down to 1800 per day)  Watch out for hidden sugar on nutrition labels Avoid products with over 8 g of added sugar per serving  Try adding in some healthy fats: Extra virgin olive oil Nuts and seeds Salmon Avocado or avocado oil

## 2023-11-05 ENCOUNTER — Ambulatory Visit: Payer: BC Managed Care – PPO | Admitting: Nurse Practitioner

## 2023-11-05 ENCOUNTER — Encounter: Payer: Self-pay | Admitting: Nurse Practitioner

## 2023-11-05 VITALS — BP 133/83 | HR 84 | Temp 98.0°F | Ht 62.0 in | Wt 222.0 lb

## 2023-11-05 DIAGNOSIS — E66813 Obesity, class 3: Secondary | ICD-10-CM | POA: Diagnosis not present

## 2023-11-05 DIAGNOSIS — R7989 Other specified abnormal findings of blood chemistry: Secondary | ICD-10-CM

## 2023-11-05 DIAGNOSIS — Z6841 Body Mass Index (BMI) 40.0 and over, adult: Secondary | ICD-10-CM

## 2023-11-05 NOTE — Progress Notes (Signed)
Office: [phone]  /  Fax: [phone]  WEIGHT SUMMARY AND BIOMETRICS  Weight Lost Since Last Visit: 1lb  Weight Gained Since Last Visit: 0lb   Vitals Temp: 98 F (36.7 C) BP: 133/83 Pulse Rate: 84 SpO2: 99 %   Anthropometric Measurements Height: 5\' 2"  (1.575 m) Weight: 222 lb (100.7 kg) BMI (Calculated): 40.59 Weight at Last Visit: 223lb Weight Lost Since Last Visit: 1lb Weight Gained Since Last Visit: 0lb Starting Weight: 214lb Total Weight Loss (lbs): 0 lb (0 kg)   Body Composition  Body Fat %: 46 % Fat Mass (lbs): 102.4 lbs Muscle Mass (lbs): 114.2 lbs Total Body Water (lbs): 83.4 lbs Visceral Fat Rating : 12   Other Clinical Data Fasting: no Labs: no Today's Visit #: 10 Starting Date: 01/02/23     HPI  Chief Complaint: OBESITY  Sara Bryant is here to discuss her progress with her obesity treatment plan. She is on the keeping a food journal and adhering to recommended goals of 1800-2200 calories and 120 protein and states she is following her eating plan approximately 95 % of the time. She states she is exercising 60 minutes 7 days per week.   Interval History:  Since last office visit she has lost 1 pound.  She saw Dr. Cathey Endow last on 10/03/23.  Dr. Cathey Endow recommended "Increased her recommended kcal intake based on her moderate intensity workouts 6 days/ wk (BMR x 1.55) and created her new calorie deficit based on this.  On non workout days, she can keep kcal at 1800 and on workout days, up to 2200 kcal/ day".  Laketia is eating 2100 calories, 200-250 carbs, 50-60 fats and 115-130 protein on workout days. Struggles with eating 2200 calories.   She states she is working out 6-7 days per week. On the weekends she has started exercising using the rowing machine, biking and running. She does resistance training 3 days per week. She is averaging around >10,000 steps per day.   Her mother is having hip surgery the end of the month. She continues to drink her [employer] smoothies in the am.    Pharmacotherapy for weight loss: She is not currently taking medications  for medical weight loss.     Previous pharmacotherapy for medical weight loss:  None   Bariatric surgery:  Patient has not had bariatric surgery     PHYSICAL EXAM:  Blood pressure 133/83, pulse 84, temperature 98 F (36.7 C), height 5\' 2"  (1.575 m), weight 222 lb (100.7 kg), SpO2 99%. Body mass index is 40.6 kg/m.  General: She is overweight, cooperative, alert, well developed, and in no acute distress. PSYCH: Has normal mood, affect and thought process.   Extremities: No edema.  Neurologic: No gross sensory or motor deficits. No tremors or fasciculations noted.    DIAGNOSTIC DATA REVIEWED:  BMET    Component Value Date/Time   NA 140 06/02/2021 0842   K 4.5 06/02/2021 0842   CL 106 06/02/2021 0842   CO2 25 06/02/2021 0842   GLUCOSE 85 06/02/2021 0842   BUN 11 06/02/2021 0842   CREATININE 0.77 06/02/2021 0842   CALCIUM 9.3 06/02/2021 0842   GFRNONAA >60 06/02/2021 0842   Lab Results  Component Value Date   HGBA1C 5.1 01/02/2023   Lab Results  Component Value Date   INSULIN 9.5 01/02/2023   No results found for: "TSH" CBC    Component Value Date/Time   WBC 7.3 06/02/2021 0842   RBC 5.07 06/02/2021 0842   HGB  14.7 06/02/2021 0842   HCT 45.8 06/02/2021 0842   PLT 280 06/02/2021 0842   MCV 90.3 06/02/2021 0842   MCH 29.0 06/02/2021 0842   MCHC 32.1 06/02/2021 0842   RDW 13.4 06/02/2021 0842   Iron Studies No results found for: "IRON", "TIBC", "FERRITIN", "IRONPCTSAT" Lipid Panel  No results found for: "CHOL", "TRIG", "HDL", "CHOLHDL", "VLDL", "LDLCALC", "LDLDIRECT" Hepatic Function Panel     Component Value Date/Time   PROT 7.7 06/02/2021 0842   ALBUMIN 3.7 06/02/2021 0842   AST 25 06/02/2021 0842   ALT 25 06/02/2021 0842   ALKPHOS 62 06/02/2021 0842   BILITOT 0.9 06/02/2021 0842   No results found for: "TSH" Nutritional Lab Results  Component  Value Date   VD25OH 52.0 01/02/2023     ASSESSMENT AND PLAN  TREATMENT PLAN FOR OBESITY:  Recommended Dietary Goals  Kaithlyn is currently in the action stage of change. As such, her goal is to continue weight management plan. She has agreed to keeping a food journal and adhering to recommended goals of 2200 calories and >100 grams protein.  Behavioral Intervention  We discussed the following Behavioral Modification Strategies today: increasing lean protein intake to established goals, decreasing simple carbohydrates , increasing vegetables, increasing fiber rich foods, increasing water intake , work on tracking and journaling calories using tracking application, and continue to work on maintaining a reduced calorie state, getting the recommended amount of protein, incorporating whole foods, making healthy choices, staying well hydrated and practicing mindfulness when eating..  Additional resources provided today: NA  Recommended Physical Activity Goals  Kimbley has been advised to work up to 150 minutes of moderate intensity aerobic activity a week and strengthening exercises 2-3 times per week for cardiovascular health, weight loss maintenance and preservation of muscle mass.   She has agreed to Continue current level of physical activity    ASSOCIATED CONDITIONS ADDRESSED TODAY  Action/Plan  Elevated morning serum cortisol level -     Cortisol  Class 3 severe obesity due to excess calories with body mass index (BMI) of 40.0 to 44.9 in adult, unspecified whether serious comorbidity present [facility])     She continues to eat high carbs due to complaints of hypoglycemia.   Try eating smaller meals more frequently, eating a balanced diet, and avoiding foods that trigger hypoglycemia.  Eat small meals and snacks every 3-4 hours  Choose lean protein, whole grains, fruits, vegetables, and low-fat dairy  Avoid foods high in refined sugars and carbohydrates, like white bread  Choose foods  with a low glycemic index, like beans, quinoa, oatmeal, and barley  Eat foods high in soluble fiber, like fruits, vegetables, beans, and whole grains  Limit or avoid alcohol and caffeine  Eat more fiber.  Stop sugary drinks!!! Eat more protein Drink more water.  Reduce carbs intake-eat carbs early   She is scheduled for CPE and labs this month   Return in about 4 weeks (around 12/03/2023).Marland Kitchen She was informed of the importance of frequent follow up visits to maximize her success with intensive lifestyle modifications for her multiple health conditions.   ATTESTASTION STATEMENTS:  Reviewed by clinician on day of visit: allergies, medications, problem list, medical history, surgical history, family history, social history, and previous encounter notes.   Time spent on visit including pre-visit chart review and post-visit care and charting was 30 minutes.    Theodis Sato. Konstance Happel FNP-C

## 2023-11-06 ENCOUNTER — Other Ambulatory Visit: Payer: Self-pay | Admitting: Nurse Practitioner

## 2023-11-06 DIAGNOSIS — R7989 Other specified abnormal findings of blood chemistry: Secondary | ICD-10-CM

## 2023-11-06 LAB — CORTISOL: Cortisol: 28.9 ug/dL — ABNORMAL HIGH (ref 6.2–19.4)

## 2023-11-11 NOTE — Progress Notes (Signed)
LMVM for patient to return call. 

## 2023-11-12 ENCOUNTER — Telehealth: Payer: Self-pay | Admitting: *Deleted

## 2023-11-12 NOTE — Progress Notes (Signed)
LMVM for patient to return call. 

## 2023-11-12 NOTE — Telephone Encounter (Signed)
-----   Message from [facility] Luther M sent at 11/12/2023  2:47 PM EST -----  ----- Message ----- From: Irene Limbo, FNP Sent: 11/06/2023   9:33 AM EST To: Darlyne Russian Mabeus, CMA  Will you call the patient and let her know that I have referred her to endo for elevated cortisol level. I did discuss with the patient that if it was elevated I would refer her. ----- Message ----- From: Interface, [facility] Lab Results In Sent: 11/06/2023   5:38 AM EST To: Irene Limbo, FNP

## 2023-11-12 NOTE — Telephone Encounter (Signed)
Phone call to patient to give her her blood test results. Per [name] she put in referral to [facility] endocrinology to evaluate high cortisol levels. Per patient [place] is [place] far to drive. Patient wanted something closer to home. I told patient she can search for a location closer to home and if they need a referral we will be happy to put one in for her.  Thanks Selena Batten CMA

## 2023-11-19 DIAGNOSIS — R947 Abnormal results of other endocrine function studies: Secondary | ICD-10-CM | POA: Diagnosis not present

## 2023-11-19 DIAGNOSIS — J45909 Unspecified asthma, uncomplicated: Secondary | ICD-10-CM | POA: Diagnosis not present

## 2023-11-19 DIAGNOSIS — Z13220 Encounter for screening for lipoid disorders: Secondary | ICD-10-CM | POA: Diagnosis not present

## 2023-11-19 DIAGNOSIS — Z0000 Encounter for general adult medical examination without abnormal findings: Secondary | ICD-10-CM | POA: Diagnosis not present

## 2023-11-19 DIAGNOSIS — Z8639 Personal history of other endocrine, nutritional and metabolic disease: Secondary | ICD-10-CM | POA: Diagnosis not present

## 2023-11-19 DIAGNOSIS — E270 Other adrenocortical overactivity: Secondary | ICD-10-CM | POA: Diagnosis not present

## 2023-11-19 LAB — BASIC METABOLIC PANEL
BUN: 11 (ref 4–21)
CO2: 28 — AB (ref 13–22)
Chloride: 106 (ref 99–108)
Creatinine: 0.7 (ref 0.5–1.1)
Glucose: 81
Potassium: 4.4 meq/L (ref 3.5–5.1)
Sodium: 138 (ref 137–147)

## 2023-11-19 LAB — CBC AND DIFFERENTIAL
HCT: 45 (ref 36–46)
Hemoglobin: 15 (ref 12.0–16.0)
Platelets: 328 10*3/uL (ref 150–400)
WBC: 7.1

## 2023-11-19 LAB — HEPATIC FUNCTION PANEL
ALT: 44 U/L — AB (ref 7–35)
AST: 29 (ref 13–35)
Bilirubin, Total: 0.9

## 2023-11-19 LAB — LIPID PANEL
Cholesterol: 171 (ref 0–200)
HDL: 44 (ref 35–70)
LDL Cholesterol: 111
LDl/HDL Ratio: 3.9
Triglycerides: 86 (ref 40–160)

## 2023-11-19 LAB — TSH: TSH: 0.57 (ref 0.41–5.90)

## 2023-11-19 LAB — CBC: RBC: 5.09 (ref 3.87–5.11)

## 2023-11-19 LAB — COMPREHENSIVE METABOLIC PANEL
Albumin: 3.8 (ref 3.5–5.0)
Calcium: 9 (ref 8.7–10.7)
eGFR: 109

## 2023-11-19 LAB — HEMOGLOBIN A1C: Hemoglobin A1C: 5.1

## 2023-11-21 DIAGNOSIS — R7989 Other specified abnormal findings of blood chemistry: Secondary | ICD-10-CM | POA: Diagnosis not present

## 2023-11-21 DIAGNOSIS — R635 Abnormal weight gain: Secondary | ICD-10-CM | POA: Diagnosis not present

## 2023-11-25 DIAGNOSIS — R7989 Other specified abnormal findings of blood chemistry: Secondary | ICD-10-CM | POA: Diagnosis not present

## 2023-12-10 ENCOUNTER — Ambulatory Visit: Payer: BC Managed Care – PPO | Admitting: Nurse Practitioner

## 2023-12-10 ENCOUNTER — Encounter: Payer: Self-pay | Admitting: Nurse Practitioner

## 2023-12-10 VITALS — BP 118/72 | HR 80 | Temp 98.0°F | Ht 62.0 in | Wt 221.0 lb

## 2023-12-10 DIAGNOSIS — Z6841 Body Mass Index (BMI) 40.0 and over, adult: Secondary | ICD-10-CM | POA: Diagnosis not present

## 2023-12-10 DIAGNOSIS — E66813 Obesity, class 3: Secondary | ICD-10-CM

## 2023-12-10 DIAGNOSIS — R7989 Other specified abnormal findings of blood chemistry: Secondary | ICD-10-CM

## 2023-12-10 NOTE — Progress Notes (Signed)
 Office: [phone]  /  Fax: [phone]  WEIGHT SUMMARY AND BIOMETRICS  Weight Lost Since Last Visit: 1lb  Weight Gained Since Last Visit: 0lb   Vitals Temp: 98 F (36.7 C) BP: 118/72 Pulse Rate: 80 SpO2: 99 %   Anthropometric Measurements Height: 5\' 2"  (1.575 m) Weight: 221 lb (100.2 kg) BMI (Calculated): 40.41 Weight at Last Visit: 222lb Weight Lost Since Last Visit: 1lb Weight Gained Since Last Visit: 0lb Starting Weight: 214lb Total Weight Loss (lbs): 0 lb (0 kg)   Body Composition  Body Fat %: 45.5 % Fat Mass (lbs): 100.6 lbs Muscle Mass (lbs): 114.6 lbs Total Body Water (lbs): 83 lbs Visceral Fat Rating : 12   Other Clinical Data Fasting: Yes Labs: No Today's Visit #: 11 Starting Date: 01/02/23     HPI  Chief Complaint: OBESITY  Sara Bryant is here to discuss her progress with her obesity treatment plan. She is on the keeping a food journal and adhering to recommended goals of 1800-2200 calories and 120 protein and states she is following her eating plan approximately 100 % of the time. She states she is exercising 45-90 minutes 6-7 days per week.   Interval History:  Since last office visit she has lost 1 pound.  She has been sick since her last visit and struggled with following the meal plan or exercising. She is eating more healthy fats and is increasing her protein intake. She has been using [employer] 4 days per week.  She has been doing more cardio since her gym is remodeling  She is drinking water.  She is averaging over 10,000 steps during the week and over 20,000 on the weekend.  Reports polyphagia and cravings mostly on the weekends.   She saw Dr. Tenny Craw a week after seeing me last and had labs obtained. Will send to me to review.    Pharmacotherapy for weight loss: She is not currently taking medications  for medical weight loss.     Previous pharmacotherapy for medical weight loss:  None   Bariatric surgery:  Patient has not had bariatric  surgery    Elevated serum cortisol level Saw endo on 3/4 and has a follow up visit this week   PHYSICAL EXAM:  Blood pressure 118/72, pulse 80, temperature 98 F (36.7 C), height 5\' 2"  (1.575 m), weight 221 lb (100.2 kg), SpO2 99%. Body mass index is 40.42 kg/m.  General: She is overweight, cooperative, alert, well developed, and in no acute distress. PSYCH: Has normal mood, affect and thought process.   Extremities: No edema.  Neurologic: No gross sensory or motor deficits. No tremors or fasciculations noted.    DIAGNOSTIC DATA REVIEWED:  BMET    Component Value Date/Time   NA 140 06/02/2021 0842   K 4.5 06/02/2021 0842   CL 106 06/02/2021 0842   CO2 25 06/02/2021 0842   GLUCOSE 85 06/02/2021 0842   BUN 11 06/02/2021 0842   CREATININE 0.77 06/02/2021 0842   CALCIUM 9.3 06/02/2021 0842   GFRNONAA >60 06/02/2021 0842   Lab Results  Component Value Date   HGBA1C 5.1 01/02/2023   Lab Results  Component Value Date   INSULIN 9.5 01/02/2023   No results found for: "TSH" CBC    Component Value Date/Time   WBC 7.3 06/02/2021 0842   RBC 5.07 06/02/2021 0842   HGB 14.7 06/02/2021 0842   HCT 45.8 06/02/2021 0842   PLT 280 06/02/2021 0842   MCV 90.3 06/02/2021 0842   MCH  29.0 06/02/2021 0842   MCHC 32.1 06/02/2021 0842   RDW 13.4 06/02/2021 0842   Iron Studies No results found for: "IRON", "TIBC", "FERRITIN", "IRONPCTSAT" Lipid Panel  No results found for: "CHOL", "TRIG", "HDL", "CHOLHDL", "VLDL", "LDLCALC", "LDLDIRECT" Hepatic Function Panel     Component Value Date/Time   PROT 7.7 06/02/2021 0842   ALBUMIN 3.7 06/02/2021 0842   AST 25 06/02/2021 0842   ALT 25 06/02/2021 0842   ALKPHOS 62 06/02/2021 0842   BILITOT 0.9 06/02/2021 0842   No results found for: "TSH" Nutritional Lab Results  Component Value Date   VD25OH 52.0 01/02/2023     ASSESSMENT AND PLAN  TREATMENT PLAN FOR OBESITY:  Recommended Dietary Goals  Claryce is currently in the action  stage of change. As such, her goal is to continue weight management plan. She has agreed to On non workout days, she can keep kcal at 1800 and on workout days, up to 2200 kcal/ day.    Behavioral Intervention  We discussed the following Behavioral Modification Strategies today: increasing lean protein intake to established goals, decreasing simple carbohydrates , increasing water intake , work on meal planning and preparation, work on tracking and journaling calories using tracking application, reading food labels , keeping healthy foods at home, planning for success, and continue to work on maintaining a reduced calorie state, getting the recommended amount of protein, incorporating whole foods, making healthy choices, staying well hydrated and practicing mindfulness when eating..  Additional resources provided today: NA  Recommended Physical Activity Goals  Jeraldine has been advised to work up to 150 minutes of moderate intensity aerobic activity a week and strengthening exercises 2-3 times per week for cardiovascular health, weight loss maintenance and preservation of muscle mass.   She has agreed to Continue current level of physical activity , Think about enjoyable ways to increase daily physical activity and overcoming barriers to exercise, and Increase physical activity in their day and reduce sedentary time (increase NEAT).   ASSOCIATED CONDITIONS ADDRESSED TODAY  Action/Plan  Elevated morning serum cortisol level Keep appt with endo this week.    Class 3 severe obesity due to excess calories without serious comorbidity with body mass index (BMI) of 40.0 to 44.9 in adult [facility])        Return in about 4 weeks (around 01/07/2024).Marland Kitchen She was informed of the importance of frequent follow up visits to maximize her success with intensive lifestyle modifications for her multiple health conditions.   ATTESTASTION STATEMENTS:  Reviewed by clinician on day of visit: allergies, medications,  problem list, medical history, surgical history, family history, social history, and previous encounter notes.   Time spent on visit including pre-visit chart review and post-visit care and charting was 30 minutes.    Theodis Sato. Toshiro Hanken FNP-C

## 2023-12-11 ENCOUNTER — Encounter: Payer: Self-pay | Admitting: Nurse Practitioner

## 2023-12-13 DIAGNOSIS — R6252 Short stature (child): Secondary | ICD-10-CM | POA: Diagnosis not present

## 2023-12-13 DIAGNOSIS — E669 Obesity, unspecified: Secondary | ICD-10-CM | POA: Diagnosis not present

## 2023-12-13 DIAGNOSIS — N915 Oligomenorrhea, unspecified: Secondary | ICD-10-CM | POA: Diagnosis not present

## 2023-12-13 DIAGNOSIS — R635 Abnormal weight gain: Secondary | ICD-10-CM | POA: Diagnosis not present

## 2023-12-27 DIAGNOSIS — R635 Abnormal weight gain: Secondary | ICD-10-CM | POA: Diagnosis not present

## 2023-12-27 DIAGNOSIS — N915 Oligomenorrhea, unspecified: Secondary | ICD-10-CM | POA: Diagnosis not present

## 2023-12-27 DIAGNOSIS — R6252 Short stature (child): Secondary | ICD-10-CM | POA: Diagnosis not present

## 2024-01-20 ENCOUNTER — Ambulatory Visit: Admitting: Nurse Practitioner

## 2024-01-20 ENCOUNTER — Encounter: Payer: Self-pay | Admitting: Nurse Practitioner

## 2024-01-20 VITALS — BP 120/78 | HR 78 | Temp 98.0°F | Ht 62.0 in | Wt 222.0 lb

## 2024-01-20 DIAGNOSIS — R7989 Other specified abnormal findings of blood chemistry: Secondary | ICD-10-CM

## 2024-01-20 DIAGNOSIS — E66813 Obesity, class 3: Secondary | ICD-10-CM | POA: Diagnosis not present

## 2024-01-20 DIAGNOSIS — Z6841 Body Mass Index (BMI) 40.0 and over, adult: Secondary | ICD-10-CM | POA: Diagnosis not present

## 2024-01-20 NOTE — Progress Notes (Signed)
 Office: [phone]  /  Fax: [phone]  WEIGHT SUMMARY AND BIOMETRICS  Weight Lost Since Last Visit: 0lb  Weight Gained Since Last Visit: 1lb   Vitals Temp: 98 F (36.7 C) BP: 120/78 Pulse Rate: 78 SpO2: 98 %   Anthropometric Measurements Height: 5\' 2"  (1.575 m) Weight: 222 lb (100.7 kg) BMI (Calculated): 40.59 Weight at Last Visit: 221lb Weight Lost Since Last Visit: 0lb Weight Gained Since Last Visit: 1lb Starting Weight: 214lb Total Weight Loss (lbs): 0 lb (0 kg)   Body Composition  Body Fat %: 45.3 % Fat Mass (lbs): 100.6 lbs Muscle Mass (lbs): 115.2 lbs Total Body Water (lbs): 82.8 lbs Visceral Fat Rating : 12   Other Clinical Data Fasting: Yes Labs: No Today's Visit #: 12 Starting Date: 01/02/23     HPI  Chief Complaint: OBESITY  Sara Bryant is here to discuss her progress with her obesity treatment plan. She is on the  1800-2200 calories based upon days she is exercising or not exerciisng  and states she is following her eating plan approximately  100  % of the time. She states she is exercising 45-120 minutes 7 days per week.   Interval History:  Since last office visit she has gained 1 pound.  She is averaging around 1800-2200 calories, 60-80 grams of fat and 110-120 grams of protein. She is drinking water daily and electrolytes when she exercises.  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    Pharmacotherapy for weight loss: She is not currently taking medications  for medical weight loss.     Previous pharmacotherapy for medical weight loss:  None   Bariatric surgery:  Patient has not had bariatric surgery    Elevated serum cortisol level  She saw endo twice her last visit.  Will see him back on a PRN basis.    PHYSICAL EXAM:  Blood pressure 120/78, pulse 78, temperature 98 F (36.7 C), height 5\' 2"  (1.575 m), weight 222 lb (100.7 kg), SpO2 98%. Body mass index is 40.6 kg/m.  General: She is  overweight, cooperative, alert, well developed, and in no acute distress. PSYCH: Has normal mood, affect and thought process.   Extremities: No edema.  Neurologic: No gross sensory or motor deficits. No tremors or fasciculations noted.    DIAGNOSTIC DATA REVIEWED:  BMET    Component Value Date/Time   NA 138 11/19/2023 0858   K 4.4 11/19/2023 0858   CL 106 11/19/2023 0858   CO2 28 (A) 11/19/2023 0858   GLUCOSE 85 06/02/2021 0842   BUN 11 11/19/2023 0858   CREATININE 0.7 11/19/2023 0858   CREATININE 0.77 06/02/2021 0842   CALCIUM 9.0 11/19/2023 0858   GFRNONAA >60 06/02/2021 0842   Lab Results  Component Value Date   HGBA1C 5.1 11/19/2023   HGBA1C 5.1 01/02/2023   Lab Results  Component Value Date   INSULIN  9.5 01/02/2023   Lab Results  Component Value Date   TSH 0.57 11/19/2023   CBC    Component Value Date/Time   WBC 7.1 11/19/2023 0858   WBC 7.3 06/02/2021 0842   RBC 5.09 11/19/2023 0858   HGB 15.0 11/19/2023 0858   HCT 45 11/19/2023 0858   PLT 328 11/19/2023 0858   MCV 90.3 06/02/2021 0842   MCH 29.0 06/02/2021 0842   MCHC 32.1 06/02/2021 0842   RDW 13.4 06/02/2021 0842   Iron Studies No results found for: "IRON", "TIBC", "FERRITIN", "IRONPCTSAT" Lipid Panel  Component Value Date/Time   CHOL 171 11/19/2023 0858   TRIG 86 11/19/2023 0858   HDL 44 11/19/2023 0858   LDLCALC 111 11/19/2023 0858   Hepatic Function Panel     Component Value Date/Time   PROT 7.7 06/02/2021 0842   ALBUMIN 3.8 11/19/2023 0858   AST 29 11/19/2023 0858   ALT 44 (A) 11/19/2023 0858   ALKPHOS 62 06/02/2021 0842   BILITOT 0.9 06/02/2021 0842      Component Value Date/Time   TSH 0.57 11/19/2023 0858   Nutritional Lab Results  Component Value Date   VD25OH 52.0 01/02/2023     ASSESSMENT AND PLAN  TREATMENT PLAN FOR OBESITY:  Recommended Dietary Goals  Sara Bryant is currently in the action stage of change. As such, her goal is to continue weight management  plan. She has agreed to On non workout days, she can keep kcal at 1800 and on workout days, up to 2200 kcal/ day.  To work on decreasing fat and carbs intake.   Behavioral Intervention  We discussed the following Behavioral Modification Strategies today: increasing lean protein intake to established goals, decreasing simple carbohydrates , increasing lower glycemic fruits, increasing fiber rich foods, increasing water intake , work on meal planning and preparation, work on tracking and journaling calories using tracking application, and continue to work on maintaining a reduced calorie state, getting the recommended amount of protein, incorporating whole foods, making healthy choices, staying well hydrated and practicing mindfulness when eating..  Additional resources provided today: NA  Recommended Physical Activity Goals  Sara Bryant has been advised to work up to 150 minutes of moderate intensity aerobic activity a week and strengthening exercises 2-3 times per week for cardiovascular health, weight loss maintenance and preservation of muscle mass.   She has agreed to Continue current level of physical activity    ASSOCIATED CONDITIONS ADDRESSED TODAY  Action/Plan  Elevated morning serum cortisol level Patient has seen endo 3 times and has been released on a PRN basis.   Class 3 severe obesity due to excess calories without serious comorbidity with body mass index (BMI) of 40.0 to 44.9 in adult [facility])         Return in about 4 weeks (around 02/17/2024).Aaron Aas She was informed of the importance of frequent follow up visits to maximize her success with intensive lifestyle modifications for her multiple health conditions.   ATTESTASTION STATEMENTS:  Reviewed by clinician on day of visit: allergies, medications, problem list, medical history, surgical history, family history, social history, and previous encounter notes.   Time spent on visit including pre-visit chart review and post-visit  care and charting was 30 minutes discussing nutrition, macros, exercise and IF    Nicholas Trompeter R. Marjory Meints FNP-C

## 2024-02-25 ENCOUNTER — Telehealth: Payer: Self-pay

## 2024-02-25 ENCOUNTER — Ambulatory Visit: Admitting: Nurse Practitioner

## 2024-02-25 ENCOUNTER — Other Ambulatory Visit: Payer: Self-pay | Admitting: Nurse Practitioner

## 2024-02-25 ENCOUNTER — Encounter: Payer: Self-pay | Admitting: Nurse Practitioner

## 2024-02-25 VITALS — BP 115/89 | HR 76 | Temp 97.9°F | Ht 62.0 in | Wt 223.0 lb

## 2024-02-25 DIAGNOSIS — R748 Abnormal levels of other serum enzymes: Secondary | ICD-10-CM | POA: Diagnosis not present

## 2024-02-25 DIAGNOSIS — Z6841 Body Mass Index (BMI) 40.0 and over, adult: Secondary | ICD-10-CM

## 2024-02-25 DIAGNOSIS — E66813 Obesity, class 3: Secondary | ICD-10-CM

## 2024-02-25 MED ORDER — ZEPBOUND 2.5 MG/0.5ML ~~LOC~~ SOAJ
2.5000 mg | SUBCUTANEOUS | 0 refills | Status: DC
Start: 2024-02-25 — End: 2024-03-30

## 2024-02-25 NOTE — Progress Notes (Signed)
 Office: [phone]  /  Fax: [phone]  WEIGHT SUMMARY AND BIOMETRICS  Weight Lost Since Last Visit: 0  Weight Gained Since Last Visit: 1lb   Vitals Temp: 97.9 F (36.6 C) BP: 115/89 Pulse Rate: 76 SpO2: 99 %   Anthropometric Measurements Height: 5\' 2"  (1.575 m) Weight: 223 lb (101.2 kg) BMI (Calculated): 40.78 Weight at Last Visit: 222lb Weight Lost Since Last Visit: 0 Weight Gained Since Last Visit: 1lb Starting Weight: 214lb Total Weight Loss (lbs): 0 lb (0 kg)   Body Composition  Body Fat %: 45.7 % Fat Mass (lbs): 102.2 lbs Muscle Mass (lbs): 115.2 lbs Total Body Water (lbs): 82.6 lbs Visceral Fat Rating : 13   Other Clinical Data Fasting: yes Labs: no Today's Visit #: 13 Starting Date: 01/02/23     HPI  Chief Complaint: OBESITY  Sara Bryant is here to discuss her progress with her obesity treatment plan. She is on the keeping a food journal and adhering to recommended goals of 1,800-2,220 calories and 110-120 protein and states she is following her eating plan approximately 100 % of the time. She states she is exercising 45-120 minutes 7 days per week.   Interval History:  Since last office visit she has gained 1 pound.  She notes that she has been under a lot of stress at work and hasn't been tracking over the past month.  She has been working 10-12 hours day.  She wasn't able to go to the gym as much as she has been going in the past.  She doesn't have any upcoming big projects, celebrations or traveling. She is drinking water and milk.  Reports polyphagia, denies cravings.    Pharmacotherapy for weight loss: She is not currently taking medications  for medical weight loss.     Previous pharmacotherapy for medical weight loss:  None   Bariatric surgery:  Patient has not had bariatric surgery   Elevated liver enzymes Last ALT was elevated at 44.  Denies abd pain. Discussed obtaining labs today.  Patient requested to wait upon obtaining labs today.     PHYSICAL EXAM:  Blood pressure 115/89, pulse 76, temperature 97.9 F (36.6 C), height 5\' 2"  (1.575 m), weight 223 lb (101.2 kg), SpO2 99%. Body mass index is 40.79 kg/m.  General: She is overweight, cooperative, alert, well developed, and in no acute distress. PSYCH: Has normal mood, affect and thought process.   Extremities: No edema.  Neurologic: No gross sensory or motor deficits. No tremors or fasciculations noted.    DIAGNOSTIC DATA REVIEWED:  BMET    Component Value Date/Time   NA 138 11/19/2023 0858   K 4.4 11/19/2023 0858   CL 106 11/19/2023 0858   CO2 28 (A) 11/19/2023 0858   GLUCOSE 85 06/02/2021 0842   BUN 11 11/19/2023 0858   CREATININE 0.7 11/19/2023 0858   CREATININE 0.77 06/02/2021 0842   CALCIUM 9.0 11/19/2023 0858   GFRNONAA >60 06/02/2021 0842   Lab Results  Component Value Date   HGBA1C 5.1 11/19/2023   HGBA1C 5.1 01/02/2023   Lab Results  Component Value Date   INSULIN  9.5 01/02/2023   Lab Results  Component Value Date   TSH 0.57 11/19/2023   CBC    Component Value Date/Time   WBC 7.1 11/19/2023 0858   WBC 7.3 06/02/2021 0842   RBC 5.09 11/19/2023 0858   HGB 15.0 11/19/2023 0858   HCT 45 11/19/2023 0858   PLT 328 11/19/2023 0858   MCV 90.3 06/02/2021 0842  MCH 29.0 06/02/2021 0842   MCHC 32.1 06/02/2021 0842   RDW 13.4 06/02/2021 0842   Iron Studies No results found for: "IRON", "TIBC", "FERRITIN", "IRONPCTSAT" Lipid Panel     Component Value Date/Time   CHOL 171 11/19/2023 0858   TRIG 86 11/19/2023 0858   HDL 44 11/19/2023 0858   LDLCALC 111 11/19/2023 0858   Hepatic Function Panel     Component Value Date/Time   PROT 7.7 06/02/2021 0842   ALBUMIN 3.8 11/19/2023 0858   AST 29 11/19/2023 0858   ALT 44 (A) 11/19/2023 0858   ALKPHOS 62 06/02/2021 0842   BILITOT 0.9 06/02/2021 0842      Component Value Date/Time   TSH 0.57 11/19/2023 0858   Nutritional Lab Results  Component Value Date   VD25OH 52.0 01/02/2023      ASSESSMENT AND PLAN  TREATMENT PLAN FOR OBESITY:  Recommended Dietary Goals  Laura-Lee is currently in the action stage of change. As such, her goal is to continue weight management plan. She has agreed to keeping a food journal and adhering to recommended goals of 1800-2000 calories and 100+ grams protein.  Behavioral Intervention  We discussed the following Behavioral Modification Strategies today: increasing lean protein intake to established goals, decreasing simple carbohydrates , increasing vegetables, increasing fiber rich foods, increasing water intake , work on meal planning and preparation, and continue to work on maintaining a reduced calorie state, getting the recommended amount of protein, incorporating whole foods, making healthy choices, staying well hydrated and practicing mindfulness when eating..  Additional resources provided today: NA  Recommended Physical Activity Goals  Katelinn has been advised to work up to 150 minutes of moderate intensity aerobic activity a week and strengthening exercises 2-3 times per week for cardiovascular health, weight loss maintenance and preservation of muscle mass.   She has agreed to Continue current level of physical activity  and continue to gradually increase the amount and intensity of exercise routine   Pharmacotherapy We discussed various medication options to help [facility] with her weight loss efforts and we both agreed to start Zepbound 2.5mg .  Side effects discussed.  Contraindications:  Pancreatitis (active gallstones) Medullary thyroid  cancer High triglycerides (>500)-will need labs prior to starting Multiple Endocrine Neoplasia syndrome type 2 (MEN 2) Trying to get pregnant Breastfeeding Use with caution with taking insulin  or sulfonylureas (will need to monitor blood sugars for hypoglycemia)  ASSOCIATED CONDITIONS ADDRESSED TODAY  Action/Plan  Elevated liver enzymes Will recheck labs at next visit  Class 3 severe  obesity due to excess calories without serious comorbidity with body mass index (BMI) of 40.0 to 44.9 in adult -     Zepbound; Inject 2.5 mg into the skin once a week.  Dispense: 2 mL; Refill: 0. Side effects discussed.           Return in about 4 weeks (around 03/24/2024).Aaron Aas She was informed of the importance of frequent follow up visits to maximize her success with intensive lifestyle modifications for her multiple health conditions.   ATTESTASTION STATEMENTS:  Reviewed by clinician on day of visit: allergies, medications, problem list, medical history, surgical history, family history, social history, and previous encounter notes.   Time spent on visit including pre-visit chart review and post-visit care and charting was 30 minutes.    Crist Dominion. Egidio Lofgren FNP-C

## 2024-02-25 NOTE — Patient Instructions (Signed)

## 2024-02-25 NOTE — Telephone Encounter (Signed)
 Submitted PA for Zepbound 2.5mg  via covermymeds.

## 2024-03-02 NOTE — Telephone Encounter (Signed)
 Zepbound  denied through insurance. Weight loss medications are a plan exclusion.

## 2024-03-02 NOTE — Telephone Encounter (Signed)
 Notified patient that per insurance weight loss medications are a plan exclusion and to follow meal plan and exercise until her next appointment with Trevor Fudge. Patient verbalized understanding.

## 2024-03-30 ENCOUNTER — Encounter: Payer: Self-pay | Admitting: Nurse Practitioner

## 2024-03-30 ENCOUNTER — Ambulatory Visit: Admitting: Nurse Practitioner

## 2024-03-30 VITALS — BP 122/78 | HR 74 | Temp 97.8°F | Ht 62.0 in | Wt 222.0 lb

## 2024-03-30 DIAGNOSIS — E66813 Obesity, class 3: Secondary | ICD-10-CM | POA: Diagnosis not present

## 2024-03-30 DIAGNOSIS — Z6841 Body Mass Index (BMI) 40.0 and over, adult: Secondary | ICD-10-CM | POA: Diagnosis not present

## 2024-03-30 DIAGNOSIS — R748 Abnormal levels of other serum enzymes: Secondary | ICD-10-CM | POA: Diagnosis not present

## 2024-03-30 MED ORDER — WEGOVY 0.25 MG/0.5ML ~~LOC~~ SOAJ: 0.2500 mg | SUBCUTANEOUS | 0 refills | Status: DC

## 2024-03-30 NOTE — Progress Notes (Signed)
 Office: [phone]  /  Fax: [phone]  WEIGHT SUMMARY AND BIOMETRICS  Weight Lost Since Last Visit: 1lb  Weight Gained Since Last Visit: 0lb   Vitals Temp: 97.8 F (36.6 C) BP: 122/78 Pulse Rate: 74 SpO2: 99 %   Anthropometric Measurements Height: 5' 2 (1.575 m) Weight: 222 lb (100.7 kg) BMI (Calculated): 40.59 Weight at Last Visit: 223lb Weight Lost Since Last Visit: 1lb Weight Gained Since Last Visit: 0lb Starting Weight: 214lb Total Weight Loss (lbs): 0 lb (0 kg)   Body Composition  Body Fat %: 43 % Fat Mass (lbs): 95.8 lbs Muscle Mass (lbs): 120.6 lbs Total Body Water (lbs): 84 lbs Visceral Fat Rating : 12   Other Clinical Data Fasting: Yes Labs: No Today's Visit #: 14 Starting Date: 01/02/23     HPI  Chief Complaint: OBESITY  Sara Bryant is here to discuss her progress with her obesity treatment plan. She is on the keeping a food journal and adhering to recommended goals of 1800-2200 calories and 110-120 protein and states she is following her eating plan approximately 100 % of the time. She states she is exercising 60 minutes 7 days per week.   Interval History:  Since last office visit she has lost 1 pound.  She is using mynetdiary and is averaging around 1800-2200 calories and 110-130 grams of protein, 190-220 grams of carbs and 45-60 fats. She is drinking water, 1/2 cup juice and skim milk (2 gallons weekly).  She is doing resistance training 3 days and cardio 4 days per week.  Notes polyphagia.    Bioimpedance showed a decrease in body fat percentage, a 5 pound increase in muscle mass and a 7 pound loss of fat mass.  Pharmacotherapy for weight loss: She is not currently taking medications  for medical weight loss.     Previous pharmacotherapy for medical weight loss:  None   Bariatric surgery:  Patient has not had bariatric surgery   Elevated liver enzymes Last ALT was elevated at 44.  Denies abd pain. Discussed obtaining labs again today.   Patient requested to wait upon obtaining labs today and requested to wait until next visit.  PHYSICAL EXAM:  Blood pressure 122/78, pulse 74, temperature 97.8 F (36.6 C), height 5' 2 (1.575 m), weight 222 lb (100.7 kg), SpO2 99%. Body mass index is 40.6 kg/m.  General: She is overweight, cooperative, alert, well developed, and in no acute distress. PSYCH: Has normal mood, affect and thought process.   Extremities: No edema.  Neurologic: No gross sensory or motor deficits. No tremors or fasciculations noted.    DIAGNOSTIC DATA REVIEWED:  BMET    Component Value Date/Time   NA 138 11/19/2023 0858   K 4.4 11/19/2023 0858   CL 106 11/19/2023 0858   CO2 28 (A) 11/19/2023 0858   GLUCOSE 85 06/02/2021 0842   BUN 11 11/19/2023 0858   CREATININE 0.7 11/19/2023 0858   CREATININE 0.77 06/02/2021 0842   CALCIUM 9.0 11/19/2023 0858   GFRNONAA >60 06/02/2021 0842   Lab Results  Component Value Date   HGBA1C 5.1 11/19/2023   HGBA1C 5.1 01/02/2023   Lab Results  Component Value Date   INSULIN  9.5 01/02/2023   Lab Results  Component Value Date   TSH 0.57 11/19/2023   CBC    Component Value Date/Time   WBC 7.1 11/19/2023 0858   WBC 7.3 06/02/2021 0842   RBC 5.09 11/19/2023 0858   HGB 15.0 11/19/2023 0858   HCT 45 11/19/2023 0858  PLT 328 11/19/2023 0858   MCV 90.3 06/02/2021 0842   MCH 29.0 06/02/2021 0842   MCHC 32.1 06/02/2021 0842   RDW 13.4 06/02/2021 0842   Iron Studies No results found for: IRON, TIBC, FERRITIN, IRONPCTSAT Lipid Panel     Component Value Date/Time   CHOL 171 11/19/2023 0858   TRIG 86 11/19/2023 0858   HDL 44 11/19/2023 0858   LDLCALC 111 11/19/2023 0858   Hepatic Function Panel     Component Value Date/Time   PROT 7.7 06/02/2021 0842   ALBUMIN 3.8 11/19/2023 0858   AST 29 11/19/2023 0858   ALT 44 (A) 11/19/2023 0858   ALKPHOS 62 06/02/2021 0842   BILITOT 0.9 06/02/2021 0842      Component Value Date/Time   TSH 0.57  11/19/2023 0858   Nutritional Lab Results  Component Value Date   VD25OH 52.0 01/02/2023     ASSESSMENT AND PLAN  TREATMENT PLAN FOR OBESITY:  Recommended Dietary Goals  Sara Bryant is currently in the action stage of change. As such, her goal is to continue weight management plan. She has agreed to keeping a food journal and adhering to recommended goals of 1800-2000 calories and 120-130 grams of  protein.  Discussed again decreasing carbs intake.  Behavioral Intervention  We discussed the following Behavioral Modification Strategies today: increasing lean protein intake to established goals, decreasing simple carbohydrates , increasing vegetables, increasing fiber rich foods, increasing water intake , and continue to work on maintaining a reduced calorie state, getting the recommended amount of protein, incorporating whole foods, making healthy choices, staying well hydrated and practicing mindfulness when eating..  Additional resources provided today: NA  Recommended Physical Activity Goals  Sara Bryant has been advised to work up to 150 minutes of moderate intensity aerobic activity a week and strengthening exercises 2-3 times per week for cardiovascular health, weight loss maintenance and preservation of muscle mass.   She has agreed to Continue current level of physical activity  and continue to gradually increase the amount and intensity of exercise routine   Pharmacotherapy We discussed various medication options to help [facility] with her weight loss efforts and we both agreed to start Wegovy  0.25mg .  Side effect discussed.  Wasn't able to start Zepbound  due to cost.  Will discuss other options based upon coverage.    Contraindications:  Pancreatitis (active gallstones) Medullary thyroid  cancer High triglycerides (>500)-will need labs prior to starting Multiple Endocrine Neoplasia syndrome type 2 (MEN 2) Trying to get pregnant Breastfeeding Use with caution with taking insulin  or  sulfonylureas (will need to monitor blood sugars for hypoglycemia)    ASSOCIATED CONDITIONS ADDRESSED TODAY  Action/Plan  Elevated liver enzymes Will obtain labs at next visit  Class 3 severe obesity due to excess calories without serious comorbidity with body mass index (BMI) of 40.0 to 44.9 in adult -     Wegovy ; Inject 0.25 mg into the skin once a week.  Dispense: 2 mL; Refill: 0.  Side effects discussed         Return in about 4 weeks (around 04/27/2024).SABRA She was informed of the importance of frequent follow up visits to maximize her success with intensive lifestyle modifications for her multiple health conditions.   ATTESTASTION STATEMENTS:  Reviewed by clinician on day of visit: allergies, medications, problem list, medical history, surgical history, family history, social history, and previous encounter notes.     Corean SAUNDERS. Sara Montelongo FNP-C

## 2024-04-12 DIAGNOSIS — L720 Epidermal cyst: Secondary | ICD-10-CM | POA: Diagnosis not present

## 2024-04-12 DIAGNOSIS — L089 Local infection of the skin and subcutaneous tissue, unspecified: Secondary | ICD-10-CM | POA: Diagnosis not present

## 2024-05-12 DIAGNOSIS — J3489 Other specified disorders of nose and nasal sinuses: Secondary | ICD-10-CM | POA: Diagnosis not present

## 2024-05-12 DIAGNOSIS — L720 Epidermal cyst: Secondary | ICD-10-CM | POA: Diagnosis not present

## 2024-05-13 ENCOUNTER — Ambulatory Visit: Admitting: Nurse Practitioner

## 2024-05-13 ENCOUNTER — Encounter: Payer: Self-pay | Admitting: Nurse Practitioner

## 2024-05-13 VITALS — BP 119/87 | HR 74 | Temp 97.9°F | Ht 62.0 in | Wt 223.0 lb

## 2024-05-13 DIAGNOSIS — E66813 Obesity, class 3: Secondary | ICD-10-CM

## 2024-05-13 DIAGNOSIS — R748 Abnormal levels of other serum enzymes: Secondary | ICD-10-CM | POA: Diagnosis not present

## 2024-05-13 DIAGNOSIS — Z6841 Body Mass Index (BMI) 40.0 and over, adult: Secondary | ICD-10-CM | POA: Diagnosis not present

## 2024-05-13 MED ORDER — PHENTERMINE-TOPIRAMATE ER 7.5-46 MG PO CP24
ORAL_CAPSULE | ORAL | 0 refills | Status: DC
Start: 2024-05-13 — End: 2024-06-15

## 2024-05-13 MED ORDER — PHENTERMINE-TOPIRAMATE ER 3.75-23 MG PO CP24: ORAL_CAPSULE | ORAL | 0 refills | Status: DC

## 2024-05-13 NOTE — Progress Notes (Signed)
 Office: [phone]  /  Fax: [phone]  WEIGHT SUMMARY AND BIOMETRICS  Weight Lost Since Last Visit: 0lb  Weight Gained Since Last Visit: 1lb   Vitals Temp: 97.9 F (36.6 C) BP: 119/87 Pulse Rate: 74 SpO2: 97 %   Anthropometric Measurements Height: 5' 2 (1.575 m) Weight: 223 lb (101.2 kg) BMI (Calculated): 40.78 Weight at Last Visit: 222lb Weight Lost Since Last Visit: 0lb Weight Gained Since Last Visit: 1lb Starting Weight: 214lb Total Weight Loss (lbs): 0 lb (0 kg)   Body Composition  Body Fat %: 45.6 % Fat Mass (lbs): 101.8 lbs Muscle Mass (lbs): 115.2 lbs Total Body Water (lbs): 82.4 lbs Visceral Fat Rating : 12   Other Clinical Data Fasting: Yes Labs: Yes Today's Visit #: 15 Starting Date: 01/02/23     HPI  Chief Complaint: OBESITY  Sara Bryant is here to discuss her progress with her obesity treatment plan. She is on the keeping a food journal and adhering to recommended goals of 1800-2200 calories and 110-120 protein and states she is following her eating plan approximately 100 % of the time. She states she is exercising 45-120 minutes 7 days per week.   Interval History:  Since last office visit she has gained 1 pound. She is averaging around 2200 calories and 130-150 grams of protein.    She is struggling with polyphagia and cravings and note it's getting worse.  She is drinking milk, water and a protein shake.  She is doing 3 days of resistance training and 4 days cardio-running or biking (2.7-3 mph for 45 mins-120 mins and biking 14 - for 45-120 mins).   Pharmacotherapy for weight loss: She is not currently taking medications  for medical weight loss.    Unable to start GLP 1 due to cost   Previous pharmacotherapy for medical weight loss:  None   Bariatric surgery:  Patient has not had bariatric surgery   Elevated liver enzymes Denies abd pain.  Took Naproxen once a week ago.  Denies alcohol intake.      Fibrosis 4 Score = .53 (Low  risk)         Interpretation for patients with HCV          <1.45       -  F0-F1 (Low risk)          1.45-3.25 -  Indeterminate           >3.25      -  F3-F4 (High risk)     Validated for ages 60-65     PHYSICAL EXAM:  Blood pressure 119/87, pulse 74, temperature 97.9 F (36.6 C), height 5' 2 (1.575 m), weight 223 lb (101.2 kg), SpO2 97%. Body mass index is 40.79 kg/m.  General: She is overweight, cooperative, alert, well developed, and in no acute distress. PSYCH: Has normal mood, affect and thought process.   Extremities: No edema.  Neurologic: No gross sensory or motor deficits. No tremors or fasciculations noted.    DIAGNOSTIC DATA REVIEWED:  BMET    Component Value Date/Time   NA 138 11/19/2023 0858   K 4.4 11/19/2023 0858   CL 106 11/19/2023 0858   CO2 28 (A) 11/19/2023 0858   GLUCOSE 85 06/02/2021 0842   BUN 11 11/19/2023 0858   CREATININE 0.7 11/19/2023 0858   CREATININE 0.77 06/02/2021 0842   CALCIUM 9.0 11/19/2023 0858   GFRNONAA >60 06/02/2021 0842   Lab Results  Component Value Date   HGBA1C 5.1 11/19/2023  HGBA1C 5.1 01/02/2023   Lab Results  Component Value Date   INSULIN  9.5 01/02/2023   Lab Results  Component Value Date   TSH 0.57 11/19/2023   CBC    Component Value Date/Time   WBC 7.1 11/19/2023 0858   WBC 7.3 06/02/2021 0842   RBC 5.09 11/19/2023 0858   HGB 15.0 11/19/2023 0858   HCT 45 11/19/2023 0858   PLT 328 11/19/2023 0858   MCV 90.3 06/02/2021 0842   MCH 29.0 06/02/2021 0842   MCHC 32.1 06/02/2021 0842   RDW 13.4 06/02/2021 0842   Iron Studies No results found for: IRON, TIBC, FERRITIN, IRONPCTSAT Lipid Panel     Component Value Date/Time   CHOL 171 11/19/2023 0858   TRIG 86 11/19/2023 0858   HDL 44 11/19/2023 0858   LDLCALC 111 11/19/2023 0858   Hepatic Function Panel     Component Value Date/Time   PROT 7.7 06/02/2021 0842   ALBUMIN 3.8 11/19/2023 0858   AST 29 11/19/2023 0858   ALT 44 (A) 11/19/2023  0858   ALKPHOS 62 06/02/2021 0842   BILITOT 0.9 06/02/2021 0842      Component Value Date/Time   TSH 0.57 11/19/2023 0858   Nutritional Lab Results  Component Value Date   VD25OH 52.0 01/02/2023     ASSESSMENT AND PLAN  TREATMENT PLAN FOR OBESITY:  Recommended Dietary Goals  Sara Bryant is currently in the action stage of change. As such, her goal is to continue weight management plan. She has agreed to keeping a food journal and adhering to recommended goals of 1800-2200 calories and 120 grams of protein.  Behavioral Intervention  We discussed the following Behavioral Modification Strategies today: decreasing simple carbohydrates , increasing water intake , work on meal planning and preparation, work on tracking and journaling calories using tracking application, reading food labels , keeping healthy foods at home, and continue to work on maintaining a reduced calorie state, getting the recommended amount of protein, incorporating whole foods, making healthy choices, staying well hydrated and practicing mindfulness when eating..  Additional resources provided today: NA  Recommended Physical Activity Goals  Sara Bryant has been advised to work up to 150 minutes of moderate intensity aerobic activity a week and strengthening exercises 2-3 times per week for cardiovascular health, weight loss maintenance and preservation of muscle mass.   She has agreed to Continue current level of physical activity    Pharmacotherapy We discussed various medication options to help Sara Bryant with her weight loss efforts and we both agreed to start Qsymia  dose 1 for two weeks and then increase to dose 2.  Side effects discussed.  Patient is on birth control pills.    ASSOCIATED CONDITIONS ADDRESSED TODAY  Action/Plan  Elevated liver enzymes -     Comprehensive metabolic panel with GFR  Class 3 severe obesity due to excess calories without serious comorbidity with body mass index (BMI) of 40.0 to 44.9 in  adult -     Phentermine -Topiramate  ER; Take one po daily for 2 weeks and then will start taking dose 2  Dispense: 14 capsule; Refill: 0 -     Phentermine -Topiramate  ER; Take one po daily after taking 2 weeks taper dose  Dispense: 30 capsule; Refill: 0         Return in about 4 weeks (around 06/10/2024).SABRA She was informed of the importance of frequent follow up visits to maximize her success with intensive lifestyle modifications for her multiple health conditions.   ATTESTASTION STATEMENTS:  Reviewed by  clinician on day of visit: allergies, medications, problem list, medical history, surgical history, family history, social history, and previous encounter notes.    Corean SAUNDERS. Louis Gaw FNP-C

## 2024-05-14 LAB — COMPREHENSIVE METABOLIC PANEL WITH GFR
ALT: 31 IU/L (ref 0–32)
AST: 28 IU/L (ref 0–40)
Albumin: 3.9 g/dL (ref 3.9–4.9)
Alkaline Phosphatase: 72 IU/L (ref 44–121)
BUN/Creatinine Ratio: 15 (ref 9–23)
BUN: 12 mg/dL (ref 6–24)
Bilirubin Total: 0.6 mg/dL (ref 0.0–1.2)
CO2: 19 mmol/L — ABNORMAL LOW (ref 20–29)
Calcium: 9.2 mg/dL (ref 8.7–10.2)
Chloride: 103 mmol/L (ref 96–106)
Creatinine, Ser: 0.8 mg/dL (ref 0.57–1.00)
Globulin, Total: 3.3 g/dL (ref 1.5–4.5)
Glucose: 76 mg/dL (ref 70–99)
Potassium: 4.6 mmol/L (ref 3.5–5.2)
Sodium: 138 mmol/L (ref 134–144)
Total Protein: 7.2 g/dL (ref 6.0–8.5)
eGFR: 95 mL/min/1.73 (ref >59)

## 2024-05-27 DIAGNOSIS — Z79899 Other long term (current) drug therapy: Secondary | ICD-10-CM | POA: Diagnosis not present

## 2024-05-27 DIAGNOSIS — J45909 Unspecified asthma, uncomplicated: Secondary | ICD-10-CM | POA: Diagnosis not present

## 2024-05-27 DIAGNOSIS — L720 Epidermal cyst: Secondary | ICD-10-CM | POA: Diagnosis not present

## 2024-05-27 DIAGNOSIS — Z7951 Long term (current) use of inhaled steroids: Secondary | ICD-10-CM | POA: Diagnosis not present

## 2024-06-02 DIAGNOSIS — Z4802 Encounter for removal of sutures: Secondary | ICD-10-CM | POA: Diagnosis not present

## 2024-06-02 DIAGNOSIS — Z48817 Encounter for surgical aftercare following surgery on the skin and subcutaneous tissue: Secondary | ICD-10-CM | POA: Diagnosis not present

## 2024-06-15 ENCOUNTER — Ambulatory Visit: Admitting: Nurse Practitioner

## 2024-06-15 VITALS — BP 109/74 | HR 82 | Temp 97.7°F | Ht 62.0 in | Wt 223.0 lb

## 2024-06-15 DIAGNOSIS — Z6841 Body Mass Index (BMI) 40.0 and over, adult: Secondary | ICD-10-CM | POA: Diagnosis not present

## 2024-06-15 DIAGNOSIS — R748 Abnormal levels of other serum enzymes: Secondary | ICD-10-CM

## 2024-06-15 DIAGNOSIS — E66813 Obesity, class 3: Secondary | ICD-10-CM

## 2024-06-15 MED ORDER — PHENTERMINE-TOPIRAMATE ER 7.5-46 MG PO CP24: ORAL_CAPSULE | ORAL | 0 refills | Status: DC

## 2024-06-15 MED ORDER — PHENTERMINE-TOPIRAMATE ER 3.75-23 MG PO CP24: ORAL_CAPSULE | ORAL | 0 refills | Status: DC

## 2024-06-15 NOTE — Patient Instructions (Signed)
 What is Qsymia and how does it work?  Qsymia is a prescription only medicine to help with your weight loss. It is a combination of two medicines that are low dose, long-acting: Phentermine & Topiramate. Qsymia contains low dose Phentermine which is a stimulant medicine that could affect your heart rate and blood pressure Qsymia is designed to help you feel satisfied faster that will help you to decrease portion size. Also, it helps curb late night snacking habits. Some food you usually enjoy may start to taste differently which will help you make healthier food choices.  This medicine will be most effective when combined with a reduced calorie diet and physical activity.  How should I take Qsymia? Take daily in the morning with breakfast. Swallow the extended-release capsule whole. Do not crush, break, or chew it.  If you miss a dose, take it as soon as possible. If it is after 12pm, skip the missed dose and go back to your regular dosing schedule. Do not take extra medicine to make up for the missed dose. You have received two separate prescriptions today. You will initially take a lower dose of 3.75mg /23mg  for 14 days then increase to a higher dose of 7.5mg /46mg  for maintenance for 30 days. There are 4 total dosing options. Your provider will discuss any need to go up to a higher dose during your office visits.  If you are taking Levothyroxine, take the Levothyroxine 1 hours before breakfast and the take the Qsymia 1 hour after breakfast. Do not stop taking Qsymia without talking to your provider. Stopping Qsymia suddenly can cause serious side effects, such as seizures and headaches.   What should I avoid while taking Qsymia? Limit caffeine to 1 small cup daily. Examples are soda, coffee, tea, herbal tea, energy drinks, and chocolate Avoid decongestant medicines like Sudafed, Mucinex-D, and Zyrtec-D. Qsymia may cause you to feel dizzy, drowsy, or confused, or to have trouble thinking  or speaking. Do not drive or do anything else that could be dangerous until you know how this medicine affects you.  Women who can become pregnant: Use effective birth control (contraception) consistently while taking Qsymia. If you miss a menstrual period, STOP Qsymia and call our office immediately. Pregnancy tests will be performed at your appointment if indicated.  What side effects may I notice when taking Qsymia? Side effects that usually do not require medical attention (report to our office if they continue or are bothersome): Dry mouth (drink at least 64 oz of fluid daily) Constipation (you may take over the counter laxative if needed) Metallic taste in your mouth when drinking carbonated beverages Numbness or tingling in the hands, arms, feet or face that lasts more than a week Headache Sudden changes in vision Mental fuzziness (problems with concentration, attention, memory or speech) Trouble sleeping (insomnia) Side effects that you should report to our office as soon as possible: Increases in heart rate and/or palpitations (feeling like your heart is racing or pounding in your chest that lasts several minutes) Chest pain Increased blood pressure Dizziness or feeling faint Shortness of breath Irritability Feeling anxious, agitated, restless, or nervous Depression or severe changes in mood Problems urinating Unusual swelling of the legs Vomiting   Other important information You will be asked to sign an informed consent prior to starting Qysmia Qsymia is a federally controlled substance. Keep Qsymia in a safe place to prevent misuse and abuse. Selling or giving away Qsymia may harm others, and is against the law.  Your prescription  will be sent to the pharmacy during your visit.  Refills will require an office visit. Your insurance may not cover the cost of this medicine. Our office will complete a pre-authorization if required by your insurance.

## 2024-06-15 NOTE — Progress Notes (Signed)
 Office: [phone]  /  Fax: [phone]  WEIGHT SUMMARY AND BIOMETRICS  Weight Lost Since Last Visit: 0lb  Weight Gained Since Last Visit: 0lb   Vitals Temp: 97.7 F (36.5 C) BP: 109/74 Pulse Rate: 82 SpO2: 99 %   Anthropometric Measurements Height: 5' 2 (1.575 m) Weight: 223 lb (101.2 kg) BMI (Calculated): 40.78 Weight at Last Visit: 223lb Weight Lost Since Last Visit: 0lb Weight Gained Since Last Visit: 0lb Starting Weight: 214lb Total Weight Loss (lbs): 0 lb (0 kg)   Body Composition  Body Fat %: 46 % Fat Mass (lbs): 102.8 lbs Muscle Mass (lbs): 114.6 lbs Total Body Water (lbs): 82 lbs Visceral Fat Rating : 13   Other Clinical Data Fasting: No Labs: No Today's Visit #: 16 Starting Date: 01/02/23     HPI  Chief Complaint: OBESITY  Sara Bryant is here to discuss her progress with her obesity treatment plan. She is on the keeping a food journal and adhering to recommended goals of 1800-2200 calories and 110-120 protein and states she is following her eating plan approximately 100 % of the time. She states she is exercising 45-120 minutes 7 days per week.   Interval History:  Since last office visit she has maintained her weight.  On gym days she is averaging around 2100-2200 calories and the days she doesn't go to the gym she is averaging around 1600-1700 calories. Her protein is 130-150 grams of protein, fats 40-60 and her carbs are 210-250.   She reports she is weighing and measuring her food.  She is drinking water and skim or 1% milk daily.  She had to stop resistance training for 2 weeks after surgery.  Since is now back to cardio and resistance training.     Pharmacotherapy for weight loss: She is not currently taking medications  for medical weight loss.   I had prescribed her Qsymia  after her last visit and she didn't start taking it due to having a cyst removal on 05/27/24.  She was told not to start it until after her surgery.    She was unable to  start a GLP-1 due to cost  Previous pharmacotherapy for medical weight loss:  None  Bariatric surgery:  Patient has not had bariatric surgery  Elevated liver enzymes Last CMP was wnl on 05/13/24   PHYSICAL EXAM:  Blood pressure 109/74, pulse 82, temperature 97.7 F (36.5 C), height 5' 2 (1.575 m), weight 223 lb (101.2 kg), SpO2 99%. Body mass index is 40.79 kg/m.  General: She is overweight, cooperative, alert, well developed, and in no acute distress. PSYCH: Has normal mood, affect and thought process.   Extremities: No edema.  Neurologic: No gross sensory or motor deficits. No tremors or fasciculations noted.    DIAGNOSTIC DATA REVIEWED:  BMET    Component Value Date/Time   NA 138 05/13/2024 0758   K 4.6 05/13/2024 0758   CL 103 05/13/2024 0758   CO2 19 (L) 05/13/2024 0758   GLUCOSE 76 05/13/2024 0758   GLUCOSE 85 06/02/2021 0842   BUN 12 05/13/2024 0758   CREATININE 0.80 05/13/2024 0758   CALCIUM 9.2 05/13/2024 0758   GFRNONAA >60 06/02/2021 0842   Lab Results  Component Value Date   HGBA1C 5.1 11/19/2023   HGBA1C 5.1 01/02/2023   Lab Results  Component Value Date   INSULIN  9.5 01/02/2023   Lab Results  Component Value Date   TSH 0.57 11/19/2023   CBC    Component Value Date/Time  WBC 7.1 11/19/2023 0858   WBC 7.3 06/02/2021 0842   RBC 5.09 11/19/2023 0858   HGB 15.0 11/19/2023 0858   HCT 45 11/19/2023 0858   PLT 328 11/19/2023 0858   MCV 90.3 06/02/2021 0842   MCH 29.0 06/02/2021 0842   MCHC 32.1 06/02/2021 0842   RDW 13.4 06/02/2021 0842   Iron Studies No results found for: IRON, TIBC, FERRITIN, IRONPCTSAT Lipid Panel     Component Value Date/Time   CHOL 171 11/19/2023 0858   TRIG 86 11/19/2023 0858   HDL 44 11/19/2023 0858   LDLCALC 111 11/19/2023 0858   Hepatic Function Panel     Component Value Date/Time   PROT 7.2 05/13/2024 0758   ALBUMIN 3.9 05/13/2024 0758   AST 28 05/13/2024 0758   ALT 31 05/13/2024 0758   ALKPHOS  72 05/13/2024 0758   BILITOT 0.6 05/13/2024 0758      Component Value Date/Time   TSH 0.57 11/19/2023 0858   Nutritional Lab Results  Component Value Date   VD25OH 52.0 01/02/2023     ASSESSMENT AND PLAN  TREATMENT PLAN FOR OBESITY:  Recommended Dietary Goals  Sara Bryant is currently in the action stage of change. As such, her goal is to continue weight management plan. She has agreed to keeping a food journal and adhering to recommended goals of 1800 calories and 100+ grams protein.  Discussed referral to RD.    Behavioral Intervention  We discussed the following Behavioral Modification Strategies today: increasing lean protein intake to established goals, decreasing simple carbohydrates , increasing vegetables, increasing fiber rich foods, increasing water intake , work on meal planning and preparation, work on tracking and journaling calories using tracking application, continue to work on maintaining a reduced calorie state, getting the recommended amount of protein, incorporating whole foods, making healthy choices, staying well hydrated and practicing mindfulness when eating., and increase protein intake, fibrous foods (25 grams per day for women, 30 grams for men) and water to improve satiety and decrease hunger signals. .  Additional resources provided today: NA  Recommended Physical Activity Goals  Sara Bryant has been advised to work up to 150 minutes of moderate intensity aerobic activity a week and strengthening exercises 2-3 times per week for cardiovascular health, weight loss maintenance and preservation of muscle mass.   She has agreed to Continue current level of physical activity    Pharmacotherapy We discussed various medication options to help Sara Bryant with her weight loss efforts and we both agreed to start Qsymia  as directed.  Side effects discussed.  ASSOCIATED CONDITIONS ADDRESSED TODAY  Action/Plan  Elevated liver enzymes Last CMP was wnl.  Will continue to  monitor  Class 3 severe obesity due to excess calories without serious comorbidity with body mass index (BMI) of 40.0 to 44.9 in adult -     Phentermine -Topiramate  ER; Take one po daily for 2 weeks and then will start taking dose 2  Dispense: 14 capsule; Refill: 0 -     Phentermine -Topiramate  ER; Take one po daily after taking 2 weeks taper dose  Dispense: 30 capsule; Refill: 0      Labs reviewed in chart from 05/13/24   Return in about 4 weeks (around 07/13/2024).SABRA She was informed of the importance of frequent follow up visits to maximize her success with intensive lifestyle modifications for her multiple health conditions.   ATTESTASTION STATEMENTS:  Reviewed by clinician on day of visit: allergies, medications, problem list, medical history, surgical history, family history, social history, and previous encounter  notes.     Corean SAUNDERS. Nesbit Michon FNP-C

## 2024-07-23 ENCOUNTER — Encounter: Payer: Self-pay | Admitting: Nurse Practitioner

## 2024-07-23 ENCOUNTER — Ambulatory Visit: Admitting: Nurse Practitioner

## 2024-07-23 VITALS — BP 130/85 | HR 94 | Temp 98.1°F | Ht 62.0 in | Wt 216.0 lb

## 2024-07-23 DIAGNOSIS — Z6839 Body mass index (BMI) 39.0-39.9, adult: Secondary | ICD-10-CM | POA: Diagnosis not present

## 2024-07-23 DIAGNOSIS — E66812 Obesity, class 2: Secondary | ICD-10-CM | POA: Diagnosis not present

## 2024-07-23 DIAGNOSIS — E65 Localized adiposity: Secondary | ICD-10-CM

## 2024-07-23 MED ORDER — PHENTERMINE-TOPIRAMATE ER 7.5-46 MG PO CP24
ORAL_CAPSULE | ORAL | 0 refills | Status: DC
Start: 2024-07-23 — End: 2024-08-18

## 2024-07-23 NOTE — Progress Notes (Signed)
 Office: [phone]  /  Fax: [phone]  WEIGHT SUMMARY AND BIOMETRICS  Weight Lost Since Last Visit: 7lb  Weight Gained Since Last Visit: 0   Vitals Temp: 98.1 F (36.7 C) BP: 130/85 Pulse Rate: 94 SpO2: 100 %   Anthropometric Measurements Height: 5' 2 (1.575 m) Weight: 216 lb (98 kg) BMI (Calculated): 39.5 Weight at Last Visit: 223lb Weight Lost Since Last Visit: 7lb Weight Gained Since Last Visit: 0 Starting Weight: 214lb Total Weight Loss (lbs): 0 lb (0 kg)   Body Composition  Body Fat %: 45 % Fat Mass (lbs): 97.4 lbs Muscle Mass (lbs): 113.2 lbs Total Body Water (lbs): 80 lbs Visceral Fat Rating : 12   Other Clinical Data Fasting: yes Labs: no Today's Visit #: 17 Starting Date: 01/02/23     HPI  Chief Complaint: OBESITY  Sara Bryant is here to discuss her progress with her obesity treatment plan. She is on the keeping a food journal and adhering to recommended goals of 1800-2200 calories and 110-120 protein and states she is following her eating plan approximately 100 % of the time. She states she is exercising 45-120 minutes 7 days per week.   Interval History:  Since last office visit she has lost 7 pounds.  On gym days she is averaging around is averaging around 2000 calories. Her protein is 100-120 grams of protein, fats 30-50 and her carbs are 210-250. She is drinking water and skim milk daily. She is exercising 7 days per week-running/biking and resistance training.    Pharmacotherapy for weight loss: She is currently taking Qsymia  dose 2 for medical weight loss.  Denies side effects.  Helps with polyphagia and cravings.    She was unable to start a GLP-1 due to cost   Previous pharmacotherapy for medical weight loss:  None   Bariatric surgery:  Patient has not had bariatric surgery     PHYSICAL EXAM:  Blood pressure 130/85, pulse 94, temperature 98.1 F (36.7 C), height 5' 2 (1.575 m), weight 216 lb (98 kg), SpO2 100%. Body mass  index is 39.51 kg/m.  General: She is overweight, cooperative, alert, well developed, and in no acute distress. PSYCH: Has normal mood, affect and thought process.   Extremities: No edema.  Neurologic: No gross sensory or motor deficits. No tremors or fasciculations noted.    DIAGNOSTIC DATA REVIEWED:  BMET    Component Value Date/Time   NA 138 05/13/2024 0758   K 4.6 05/13/2024 0758   CL 103 05/13/2024 0758   CO2 19 (L) 05/13/2024 0758   GLUCOSE 76 05/13/2024 0758   GLUCOSE 85 06/02/2021 0842   BUN 12 05/13/2024 0758   CREATININE 0.80 05/13/2024 0758   CALCIUM 9.2 05/13/2024 0758   GFRNONAA >60 06/02/2021 0842   Lab Results  Component Value Date   HGBA1C 5.1 11/19/2023   HGBA1C 5.1 01/02/2023   Lab Results  Component Value Date   INSULIN  9.5 01/02/2023   Lab Results  Component Value Date   TSH 0.57 11/19/2023   CBC    Component Value Date/Time   WBC 7.1 11/19/2023 0858   WBC 7.3 06/02/2021 0842   RBC 5.09 11/19/2023 0858   HGB 15.0 11/19/2023 0858   HCT 45 11/19/2023 0858   PLT 328 11/19/2023 0858   MCV 90.3 06/02/2021 0842   MCH 29.0 06/02/2021 0842   MCHC 32.1 06/02/2021 0842   RDW 13.4 06/02/2021 0842   Iron Studies No results found for: IRON, TIBC, FERRITIN, IRONPCTSAT Lipid Panel  Component Value Date/Time   CHOL 171 11/19/2023 0858   TRIG 86 11/19/2023 0858   HDL 44 11/19/2023 0858   LDLCALC 111 11/19/2023 0858   Hepatic Function Panel     Component Value Date/Time   PROT 7.2 05/13/2024 0758   ALBUMIN 3.9 05/13/2024 0758   AST 28 05/13/2024 0758   ALT 31 05/13/2024 0758   ALKPHOS 72 05/13/2024 0758   BILITOT 0.6 05/13/2024 0758      Component Value Date/Time   TSH 0.57 11/19/2023 0858   Nutritional Lab Results  Component Value Date   VD25OH 52.0 01/02/2023     ASSESSMENT AND PLAN  TREATMENT PLAN FOR OBESITY:  Recommended Dietary Goals  Sara Bryant is currently in the action stage of change. As such, her goal is to  continue weight management plan. She has agreed to practicing portion control and making smarter food choices, such as increasing vegetables and decreasing simple carbohydrates.  Behavioral Intervention  We discussed the following Behavioral Modification Strategies today: decreasing simple carbohydrates , increasing vegetables, increasing fiber rich foods, increasing water intake , reading food labels , keeping healthy foods at home, continue to practice mindfulness when eating, planning for success, continue to work on maintaining a reduced calorie state, getting the recommended amount of protein, incorporating whole foods, making healthy choices, staying well hydrated and practicing mindfulness when eating., and increase protein intake, fibrous foods (25 grams per day for women, 30 grams for men) and water to improve satiety and decrease hunger signals. .  Additional resources provided today: NA  Recommended Physical Activity Goals  Sara Bryant has been advised to work up to 150 minutes of moderate intensity aerobic activity a week and strengthening exercises 2-3 times per week for cardiovascular health, weight loss maintenance and preservation of muscle mass.   She has agreed to Continue current level of physical activity , Think about enjoyable ways to increase daily physical activity and overcoming barriers to exercise, Increase physical activity in their day and reduce sedentary time (increase NEAT)., and Combine aerobic and strengthening exercises for efficiency and improved cardiometabolic health.   Pharmacotherapy We discussed various medication options to help [facility] with her weight loss efforts and we both agreed to continue Qsymia  dose 2.  Side effects discussed.  Patient is on BCP.  ASSOCIATED CONDITIONS ADDRESSED TODAY  Action/Plan  Visceral obesity -     Phentermine -Topiramate  ER; Take one po daily  Dispense: 30 capsule; Refill: 0  Obesity, Class II, BMI 35-39.9 -      Phentermine -Topiramate  ER; Take one po daily  Dispense: 30 capsule; Refill: 0         Return in about 4 weeks (around 08/20/2024).SABRA She was informed of the importance of frequent follow up visits to maximize her success with intensive lifestyle modifications for her multiple health conditions.   ATTESTASTION STATEMENTS:  Reviewed by clinician on day of visit: allergies, medications, problem list, medical history, surgical history, family history, social history, and previous encounter notes.     Sara Bryant SAUNDERS. Sara Doyle FNP-C

## 2024-08-18 ENCOUNTER — Encounter: Payer: Self-pay | Admitting: Nurse Practitioner

## 2024-08-18 ENCOUNTER — Ambulatory Visit: Admitting: Nurse Practitioner

## 2024-08-18 VITALS — BP 121/88 | HR 87 | Ht 62.0 in | Wt 214.0 lb

## 2024-08-18 DIAGNOSIS — E66812 Obesity, class 2: Secondary | ICD-10-CM

## 2024-08-18 DIAGNOSIS — Z6839 Body mass index (BMI) 39.0-39.9, adult: Secondary | ICD-10-CM | POA: Diagnosis not present

## 2024-08-18 DIAGNOSIS — E65 Localized adiposity: Secondary | ICD-10-CM | POA: Diagnosis not present

## 2024-08-18 MED ORDER — PHENTERMINE-TOPIRAMATE ER 7.5-46 MG PO CP24: ORAL_CAPSULE | ORAL | 0 refills | Status: DC

## 2024-08-18 NOTE — Progress Notes (Signed)
 Office: [phone]  /  Fax: [phone]  WEIGHT SUMMARY AND BIOMETRICS  Weight Lost Since Last Visit: 2lb  Weight Gained Since Last Visit: 0lb   Vitals BP: 121/88 Pulse Rate: 87 SpO2: 98 %   Anthropometric Measurements Height: 5' 2 (1.575 m) Weight: 214 lb (97.1 kg) BMI (Calculated): 39.13 Weight at Last Visit: 216lb Weight Lost Since Last Visit: 2lb Weight Gained Since Last Visit: 0lb Starting Weight: 214lb Total Weight Loss (lbs): 0 lb (0 kg)   Body Composition  Body Fat %: 45.1 % Fat Mass (lbs): 96.8 lbs Muscle Mass (lbs): 111.8 lbs Total Body Water (lbs): 80 lbs Visceral Fat Rating : 12   Other Clinical Data Fasting: No Labs: No Today's Visit #: 18 Starting Date: 01/02/23     HPI  Chief Complaint: OBESITY  Sharnell is here to discuss her progress with her obesity treatment plan. She is on the keeping a food journal and adhering to recommended goals of 2200 calories and 110-120 protein and states she is following her eating plan approximately 100 % of the time. She states she is exercising 45-60+ minutes 7 days per week.   Interval History:  Since last office visit she has lost 2 pounds.  On gym days she is averaging around is averaging around 2000 calories. Her protein is 100-120 grams of protein, fats 30-50 and her carbs are 210-250. She is drinking water and skim milk daily.  She hurt her back and had to take a week off from exercising.  She started going back to the gym 1 week ago.    She is not concerned about gaining weight over the holiday.    Pharmacotherapy for weight loss: She is currently taking Qsymia  dose 2 for medical weight loss.  Denies side effects.  Helps with polyphagia and cravings.     She was unable to start a GLP-1 due to cost   Previous pharmacotherapy for medical weight loss:  None   Bariatric surgery:  Patient has not had bariatric surgery      PHYSICAL EXAM:  Blood pressure 121/88, pulse 87, height 5' 2 (1.575 m),  weight 214 lb (97.1 kg), SpO2 98%. Body mass index is 39.14 kg/m.  General: She is overweight, cooperative, alert, well developed, and in no acute distress. PSYCH: Has normal mood, affect and thought process.   Extremities: No edema.  Neurologic: No gross sensory or motor deficits. No tremors or fasciculations noted.    DIAGNOSTIC DATA REVIEWED:  BMET    Component Value Date/Time   NA 138 05/13/2024 0758   K 4.6 05/13/2024 0758   CL 103 05/13/2024 0758   CO2 19 (L) 05/13/2024 0758   GLUCOSE 76 05/13/2024 0758   GLUCOSE 85 06/02/2021 0842   BUN 12 05/13/2024 0758   CREATININE 0.80 05/13/2024 0758   CALCIUM 9.2 05/13/2024 0758   GFRNONAA >60 06/02/2021 0842   Lab Results  Component Value Date   HGBA1C 5.1 11/19/2023   HGBA1C 5.1 01/02/2023   Lab Results  Component Value Date   INSULIN  9.5 01/02/2023   Lab Results  Component Value Date   TSH 0.57 11/19/2023   CBC    Component Value Date/Time   WBC 7.1 11/19/2023 0858   WBC 7.3 06/02/2021 0842   RBC 5.09 11/19/2023 0858   HGB 15.0 11/19/2023 0858   HCT 45 11/19/2023 0858   PLT 328 11/19/2023 0858   MCV 90.3 06/02/2021 0842   MCH 29.0 06/02/2021 0842   MCHC 32.1 06/02/2021 0842  RDW 13.4 06/02/2021 0842   Iron Studies No results found for: IRON, TIBC, FERRITIN, IRONPCTSAT Lipid Panel     Component Value Date/Time   CHOL 171 11/19/2023 0858   TRIG 86 11/19/2023 0858   HDL 44 11/19/2023 0858   LDLCALC 111 11/19/2023 0858   Hepatic Function Panel     Component Value Date/Time   PROT 7.2 05/13/2024 0758   ALBUMIN 3.9 05/13/2024 0758   AST 28 05/13/2024 0758   ALT 31 05/13/2024 0758   ALKPHOS 72 05/13/2024 0758   BILITOT 0.6 05/13/2024 0758      Component Value Date/Time   TSH 0.57 11/19/2023 0858   Nutritional Lab Results  Component Value Date   VD25OH 52.0 01/02/2023     ASSESSMENT AND PLAN  TREATMENT PLAN FOR OBESITY:  Recommended Dietary Goals  Almeter is currently in the  action stage of change. As such, her goal is to continue weight management plan. She has agreed to keeping a food journal and adhering to recommended goals of 1800-2200 calories and 100 + grams of protein.  Needs to decrease carbs intake.    Behavioral Intervention  We discussed the following Behavioral Modification Strategies today: increasing lean protein intake to established goals, decreasing simple carbohydrates , increasing vegetables, increasing fiber rich foods, increasing water intake , work on meal planning and preparation, reading food labels , keeping healthy foods at home, continue to work on maintaining a reduced calorie state, getting the recommended amount of protein, incorporating whole foods, making healthy choices, staying well hydrated and practicing mindfulness when eating., and increase protein intake, fibrous foods (25 grams per day for women, 30 grams for men) and water to improve satiety and decrease hunger signals. .  Additional resources provided today: NA  Recommended Physical Activity Goals  Brityn has been advised to work up to 150 minutes of moderate intensity aerobic activity a week and strengthening exercises 2-3 times per week for cardiovascular health, weight loss maintenance and preservation of muscle mass.   She has agreed to Continue current level of physical activity , Think about enjoyable ways to increase daily physical activity and overcoming barriers to exercise, Increase physical activity in their day and reduce sedentary time (increase NEAT)., and Combine aerobic and strengthening exercises for efficiency and improved cardiometabolic health.   Pharmacotherapy We discussed various medication options to help [facility] with her weight loss efforts and we both agreed to continue Qsymia  dose 2.  Side effects discussed.  ASSOCIATED CONDITIONS ADDRESSED TODAY  Action/Plan  Visceral obesity -     Phentermine -Topiramate  ER; Take one po daily  Dispense: 30  capsule; Refill: 0  Obesity, Class II, BMI 35-39.9 -     Phentermine -Topiramate  ER; Take one po daily  Dispense: 30 capsule; Refill: 0      Discussed obtaining labs.  Patient reports she is scheduled for CPE and labs with PCP in March     Return in about 4 weeks (around 09/15/2024).SABRA She was informed of the importance of frequent follow up visits to maximize her success with intensive lifestyle modifications for her multiple health conditions.   ATTESTASTION STATEMENTS:  Reviewed by clinician on day of visit: allergies, medications, problem list, medical history, surgical history, family history, social history, and previous encounter notes.     Corean SAUNDERS. Cleotha Whalin FNP-C

## 2024-09-21 ENCOUNTER — Ambulatory Visit: Admitting: Nurse Practitioner

## 2024-09-21 ENCOUNTER — Encounter: Payer: Self-pay | Admitting: Nurse Practitioner

## 2024-09-21 VITALS — BP 129/88 | HR 93 | Ht 62.0 in | Wt 213.0 lb

## 2024-09-21 DIAGNOSIS — E65 Localized adiposity: Secondary | ICD-10-CM | POA: Diagnosis not present

## 2024-09-21 DIAGNOSIS — E66812 Obesity, class 2: Secondary | ICD-10-CM

## 2024-09-21 DIAGNOSIS — Z6838 Body mass index (BMI) 38.0-38.9, adult: Secondary | ICD-10-CM

## 2024-09-21 MED ORDER — PHENTERMINE-TOPIRAMATE ER 7.5-46 MG PO CP24: ORAL_CAPSULE | ORAL | 0 refills | Status: DC

## 2024-09-21 NOTE — Progress Notes (Signed)
 "  Office: [phone]  /  Fax: [phone]  WEIGHT SUMMARY AND BIOMETRICS  Weight Lost Since Last Visit: 1lb  Weight Gained Since Last Visit: 0   Vitals BP: 129/88 Pulse Rate: 93 SpO2: 99 %   Anthropometric Measurements Height: 5' 2 (1.575 m) Weight: 213 lb (96.6 kg) BMI (Calculated): 38.95 Weight at Last Visit: 214lb Weight Lost Since Last Visit: 1lb Weight Gained Since Last Visit: 0 Starting Weight: 214lb Total Weight Loss (lbs): 1 lb (0.454 kg)   Body Composition  Body Fat %: 44.5 % Fat Mass (lbs): 94.8 lbs Muscle Mass (lbs): 112.2 lbs Total Body Water (lbs): 80.2 lbs Visceral Fat Rating : 12   Other Clinical Data Fasting: yes Labs: no Today's Visit #: 19 Starting Date: 01/02/23     HPI  Chief Complaint: OBESITY  Emmalena is here to discuss her progress with her obesity treatment plan. She is on the keeping a food journal and adhering to recommended goals of 2200 calories and 110-120 protein and states she is following her eating plan approximately 100 % of the time. She states she is exercising 45-160 minutes 7 days per week.   Interval History:  Since last office visit she has lost 1 pound. She is averaging around 2000 calories per day and 110-120 grams of protein.  She is drinking water and 1 soda 5 days per week when at work.  She is doing better concerning back pain and has been going back to the gym 7 days per week.  She is doing cardio 4 days per week-bike and treadmill and is doing resistance training 3 days per week.    Bio Impedance reviewed with patient: Body fat % decreased and muscle mass increased   Pharmacotherapy for weight loss: She is currently taking Qsymia  dose 2 for medical weight loss.  Denies side effects.  Helps with polyphagia and cravings.  Denies chest pain, SHOB or palpitations.     She was unable to start a GLP-1 due to cost   Previous pharmacotherapy for medical weight loss:  None   Bariatric surgery:  Patient has not  had bariatric surgery      PHYSICAL EXAM:  Blood pressure 129/88, pulse 93, height 5' 2 (1.575 m), weight 213 lb (96.6 kg), SpO2 99%. Body mass index is 38.96 kg/m.  General: She is overweight, cooperative, alert, well developed, and in no acute distress. PSYCH: Has normal mood, affect and thought process.   Extremities: No edema.  Neurologic: No gross sensory or motor deficits. No tremors or fasciculations noted.    DIAGNOSTIC DATA REVIEWED:  BMET    Component Value Date/Time   NA 138 05/13/2024 0758   K 4.6 05/13/2024 0758   CL 103 05/13/2024 0758   CO2 19 (L) 05/13/2024 0758   GLUCOSE 76 05/13/2024 0758   GLUCOSE 85 06/02/2021 0842   BUN 12 05/13/2024 0758   CREATININE 0.80 05/13/2024 0758   CALCIUM 9.2 05/13/2024 0758   GFRNONAA >60 06/02/2021 0842   Lab Results  Component Value Date   HGBA1C 5.1 11/19/2023   HGBA1C 5.1 01/02/2023   Lab Results  Component Value Date   INSULIN  9.5 01/02/2023   Lab Results  Component Value Date   TSH 0.57 11/19/2023   CBC    Component Value Date/Time   WBC 7.1 11/19/2023 0858   WBC 7.3 06/02/2021 0842   RBC 5.09 11/19/2023 0858   HGB 15.0 11/19/2023 0858   HCT 45 11/19/2023 0858   PLT 328 11/19/2023 0858  MCV 90.3 06/02/2021 0842   MCH 29.0 06/02/2021 0842   MCHC 32.1 06/02/2021 0842   RDW 13.4 06/02/2021 0842   Iron Studies No results found for: IRON, TIBC, FERRITIN, IRONPCTSAT Lipid Panel     Component Value Date/Time   CHOL 171 11/19/2023 0858   TRIG 86 11/19/2023 0858   HDL 44 11/19/2023 0858   LDLCALC 111 11/19/2023 0858   Hepatic Function Panel     Component Value Date/Time   PROT 7.2 05/13/2024 0758   ALBUMIN 3.9 05/13/2024 0758   AST 28 05/13/2024 0758   ALT 31 05/13/2024 0758   ALKPHOS 72 05/13/2024 0758   BILITOT 0.6 05/13/2024 0758      Component Value Date/Time   TSH 0.57 11/19/2023 0858   Nutritional Lab Results  Component Value Date   VD25OH 52.0 01/02/2023      ASSESSMENT AND PLAN  TREATMENT PLAN FOR OBESITY:  Recommended Dietary Goals  Abiola is currently in the action stage of change. As such, her goal is to continue weight management plan. She has agreed to keeping a food journal and adhering to recommended goals of 1800 calories and 100 + grams protein.  Behavioral Intervention  We discussed the following Behavioral Modification Strategies today: increasing lean protein intake to established goals, decreasing simple carbohydrates , increasing vegetables, increasing fiber rich foods, increasing water intake , work on meal planning and preparation, reading food labels , keeping healthy foods at home, celebration eating strategies, continue to work on maintaining a reduced calorie state, getting the recommended amount of protein, incorporating whole foods, making healthy choices, staying well hydrated and practicing mindfulness when eating., and increase protein intake, fibrous foods (25 grams per day for women, 30 grams for men) and water to improve satiety and decrease hunger signals. .  Additional resources provided today: NA  Recommended Physical Activity Goals  Serine has been advised to work up to 150 minutes of moderate intensity aerobic activity a week and strengthening exercises 2-3 times per week for cardiovascular health, weight loss maintenance and preservation of muscle mass.   She has agreed to Continue current level of physical activity    Pharmacotherapy We discussed various medication options to help [facility] with her weight loss efforts and we both agreed to continue Qsymia  dose 2.  Side effects discussed.  ASSOCIATED CONDITIONS ADDRESSED TODAY  Action/Plan  Visceral obesity -     Phentermine -Topiramate  ER; Take one po daily  Dispense: 30 capsule; Refill: 0  Continue working on dietary changes, exercise and weight loss.   Obesity, Class II, BMI 35-39.9 -     Phentermine -Topiramate  ER; Take one po daily  Dispense: 30  capsule; Refill: 0      Discussed obtaining labs. Patient reports she is scheduled for CPE and labs with PCP in March   Discussed referral to RD   Return in about 4 weeks (around 10/19/2024).SABRA She was informed of the importance of frequent follow up visits to maximize her success with intensive lifestyle modifications for her multiple health conditions.   ATTESTASTION STATEMENTS:  Reviewed by clinician on day of visit: allergies, medications, problem list, medical history, surgical history, family history, social history, and previous encounter notes.     Corean SAUNDERS. Juanjesus Pepperman FNP-C "

## 2024-10-19 ENCOUNTER — Telehealth (INDEPENDENT_AMBULATORY_CARE_PROVIDER_SITE_OTHER): Payer: Self-pay | Admitting: Nurse Practitioner

## 2024-10-19 NOTE — Telephone Encounter (Signed)
 01/26 Cancelled Tuesday 10/20/2024 Appt due to Inclement Winter Weather/Office Closed until 10:00am on 10/20/2024. Patient r/s to 11/02/2024. Pt states that she needs a refill of the medication that Corean Scala prescribes to her. Pt states that the rx would have been filled during the cancelled 10/20/2024 appt.

## 2024-10-20 ENCOUNTER — Ambulatory Visit: Admitting: Nurse Practitioner

## 2024-10-20 NOTE — Telephone Encounter (Signed)
 Left message for patient to call back to try to get sooner appointment.

## 2024-10-27 ENCOUNTER — Other Ambulatory Visit: Payer: Self-pay | Admitting: Nurse Practitioner

## 2024-10-27 DIAGNOSIS — E66812 Obesity, class 2: Secondary | ICD-10-CM

## 2024-10-27 DIAGNOSIS — E65 Localized adiposity: Secondary | ICD-10-CM

## 2024-10-27 MED ORDER — PHENTERMINE-TOPIRAMATE ER 7.5-46 MG PO CP24: ORAL_CAPSULE | ORAL | 0 refills | Status: AC

## 2024-11-02 ENCOUNTER — Ambulatory Visit: Admitting: Nurse Practitioner
